# Patient Record
Sex: Female | Born: 1955 | Race: Black or African American | Hispanic: No | Marital: Married | Smoking: Former smoker
Health system: Southern US, Community
[De-identification: ages and names within clinical notes are randomized; demographics above are authoritative.]

## PROBLEM LIST (undated history)

## (undated) DIAGNOSIS — I10 Essential (primary) hypertension: Secondary | ICD-10-CM

## (undated) DIAGNOSIS — Z974 Presence of external hearing-aid: Secondary | ICD-10-CM

## (undated) DIAGNOSIS — G35 Multiple sclerosis: Secondary | ICD-10-CM

## (undated) DIAGNOSIS — H539 Unspecified visual disturbance: Secondary | ICD-10-CM

## (undated) HISTORY — PX: OVARIAN CYST REMOVAL: SHX89

## (undated) HISTORY — DX: Essential (primary) hypertension: I10

## (undated) HISTORY — PX: APPENDECTOMY: SHX54

## (undated) HISTORY — DX: Unspecified visual disturbance: H53.9

## (undated) HISTORY — PX: EYE SURGERY: SHX253

## (undated) HISTORY — DX: Presence of external hearing-aid: Z97.4

## (undated) HISTORY — DX: Multiple sclerosis: G35

---

## 2015-10-20 ENCOUNTER — Ambulatory Visit (INDEPENDENT_AMBULATORY_CARE_PROVIDER_SITE_OTHER): Payer: Managed Care, Other (non HMO) | Admitting: Neurology

## 2015-10-20 ENCOUNTER — Encounter: Payer: Self-pay | Admitting: Neurology

## 2015-10-20 VITALS — BP 130/94 | HR 68 | Resp 16 | Ht 64.0 in | Wt 221.5 lb

## 2015-10-20 DIAGNOSIS — R269 Unspecified abnormalities of gait and mobility: Secondary | ICD-10-CM

## 2015-10-20 DIAGNOSIS — G35 Multiple sclerosis: Secondary | ICD-10-CM

## 2015-10-20 DIAGNOSIS — R5383 Other fatigue: Secondary | ICD-10-CM | POA: Diagnosis not present

## 2015-10-20 MED ORDER — VALSARTAN 160 MG PO TABS: 160.0000 mg | ORAL_TABLET | Freq: Every day | ORAL | Status: DC

## 2015-10-20 MED ORDER — GLATIRAMER ACETATE 40 MG/ML ~~LOC~~ SOSY: PREFILLED_SYRINGE | SUBCUTANEOUS | Status: DC

## 2015-10-20 NOTE — Progress Notes (Signed)
[facility]  PATIENT: Dominique Rogers DOB: 1955/06/16  REFERRING DOCTOR OR PCP:  Joan Mayans (fax [phone]( SOURCE: patient, records from Drs. Nile Riggs and Glade Lloyd, labwork.  _________________________________   HISTORICAL  CHIEF COMPLAINT:  Chief Complaint  Patient presents with  . Multiple Sclerosis    Dominique Rogers is here for eval of MS.  Sts. she was dx. in 2004.  Presenting sx. was optic neuritis (right eye).  Dx. with MRI brain.  No LP done.  She initially saw Dr. Jolayne Haines in [place], [place].  He started her on Avonex, which she stopped after 10 yrs. due to new lesions on MRI. By that time, she was seeing Dr. Josefina Do, in [place], [place]. Dr. Glade Lloyd started her on Tecfidera, but she stopped this after about a yr. due to ? low lymmphocyte count. She then started Copaxone, which she is   . Fatigue    still on today.  Sts. she feels sx. have been stable on Copaxone, doesn't think most recent MRI (done at [facility] in [place], [place]  Sts. the sx. that is most noticeable to her is fatigue.  She needs a new rx. for Copaxone--gave last inj. last night and is now out/fim    HISTORY OF PRESENT ILLNESS:  I had the pleasure seeing your patient, Dominique Rogers, at [facility] for a neurologic consultation regarding her multiple sclerosis.    She is a 60 yo woman who was diagnosed with MS in 2004.   MS history: She was diagnosed in 2004 after presenting with right optic neuritis.   She saw her ophthalmologist who ordered an MRI. The images were consistent with multiple sclerosis and she was referred to Dr. Vito Backers in [place]. He did not think that she needed to have a lumbar puncture. She was started on Avonex initially did very well at some point she switch to another doctor, Dr. Glade Lloyd in [place]. She was having mild side effects with Avonex and MRI showed mild progression and she switched to  Tecfidera. However, on Tecfidera she had low lymphocyte counts and it was discontinued.  She was started on Copaxone about 2 years ago. She continues on Copaxone and has done well. Her most recent MRI was around August 2016 and she was told that it was stable. She is just about out of her Copaxone shots.  Gait/strength/sensation:   She reports no major difficulties with her gait. She has never needed to use a cane. She denies any significant difficulty with weakness or numbness. There are no uncomfortable dysesthetic sensations.  Bladder/bowel: In the past, she has urinary frequency and urgency. She never had incontinence. She was once on a medication but no longer needs to take anything as the frequency has improved. Denies any bowel dysfunction.  Vision: She got a complete recovery of the vision from her earlier bout of optic neuritis. She denies any diplopia or eye pain.  Fatigue/sleep: She reports fatigue is both physical and cognitive. This usually worsens as the day goes on and will also worsen with heat never taken anything for the fatigue. This is probably her biggest problem at this point. She usually sleeps well. She occasionally will take a melatonin. Since moving to [facility], she has needed to commute back and forth to [facility] about once a week. The rest of the time she calls from 6 AM to 9 PM still has a fairly variable schedule.  Mood/cognition: She notes some irritability and  mild depression but nothing serious. She denies anxiety. She has not had any significant issues with focus and attention. At times, she feels her memory is not as good as it used to be.  REVIEW OF SYSTEMS: Constitutional: No fevers, chills, sweats, or change in appetite.  She reports fatigue.   Occasional insomnia. Eyes: No visual changes, double vision, eye pain Ear, nose and throat: No hearing loss, ear pain, nasal congestion, sore throat Cardiovascular: No chest pain, palpitations Respiratory: No  shortness of breath at rest or with exertion.   No wheezes GastrointestinaI: No nausea, vomiting, diarrhea, abdominal pain, fecal incontinence Genitourinary: No dysuria, urinary retention or frequency.  No nocturia. Musculoskeletal: No neck pain, back pain Integumentary: No rash, pruritus, skin lesions Neurological: as above Psychiatric: Mild depression at this time.  No anxiety Endocrine: No palpitations, diaphoresis, change in appetite, change in weigh or increased thirst Hematologic/Lymphatic: No anemia, purpura, petechiae. Allergic/Immunologic: No itchy/runny eyes, nasal congestion, recent allergic reactions, rashes  ALLERGIES: No Known Allergies  HOME MEDICATIONS:  Current outpatient prescriptions:  .  cholecalciferol (VITAMIN D) 1000 units tablet, Take 1,000 Units by mouth daily., Disp: , Rfl:  .  ezetimibe (ZETIA) 10 MG tablet, Take 10 mg by mouth daily., Disp: , Rfl:  .  Multiple Vitamin (MULTIVITAMIN) tablet, Take 1 tablet by mouth daily., Disp: , Rfl:  .  vitamin B-12 (CYANOCOBALAMIN) 1000 MCG tablet, Take 1,000 mcg by mouth daily., Disp: , Rfl:  .  Glatiramer Acetate (COPAXONE) 40 MG/ML SOSY, One subcutaneous three times a week, Disp: 30 Syringe, Rfl: 4 .  valsartan (DIOVAN) 160 MG tablet, Take 1 tablet (160 mg total) by mouth daily., Disp: 90 tablet, Rfl: 4  PAST MEDICAL HISTORY: Past Medical History  Diagnosis Date  . Vision abnormalities   . Multiple sclerosis (HCC)   . Hypertension     PAST SURGICAL HISTORY: Past Surgical History  Procedure Laterality Date  . Appendectomy    . Ovarian cyst removal    . Eye surgery      FAMILY HISTORY: Family History  Problem Relation Age of Onset  . Diabetes Mother   . Heart disease Mother   . Breast cancer Mother   . Breast cancer Sister     SOCIAL HISTORY:  Social History   Social History  . Marital Status: Married    Spouse Name: N/A  . Number of Children: N/A  . Years of Education: N/A   Occupational  History  . Not on file.   Social History Main Topics  . Smoking status: Former Games developer  . Smokeless tobacco: Not on file  . Alcohol Use: 0.0 oz/week    0 Standard drinks or equivalent per week     Comment: occasional  . Drug Use: No  . Sexual Activity: Not on file   Other Topics Concern  . Not on file   Social History Narrative  . No narrative on file     PHYSICAL EXAM  Filed Vitals:   10/20/15 1029  BP: 130/94  Pulse: 68  Resp: 16  Height:  (1.626 m)  Weight: 221 lb 8 oz (100.472 kg)    Body mass index is 38 kg/(m^2).   General: The patient is well-developed and well-nourished and in no acute distress  Eyes:  Funduscopic exam shows normal optic discs and retinal vessels.   She has astigmatism.     Neck: The neck is supple, no carotid bruits are noted.  The neck is nontender.  Cardiovascular: The heart has  a regular rate and rhythm with a normal S1 and S2. There were no murmurs, gallops or rubs. Lungs are clear to auscultation.  Skin: Extremities are without rash or edema.  Nontender  Musculoskeletal:  Back is nontender  Neurologic Exam  Mental status: The patient is alert and oriented x 3 at the time of the examination. The patient has apparent normal recent and remote memory, with an apparently normal attention span and concentration ability.   Speech is normal.  Cranial nerves: Extraocular movements are full. Pupils show 1+ right APD.  Color vision is symmetric.  Visual fields are full.  Facial symmetry is present. There is good facial sensation to soft touch bilaterally.Facial strength is normal.  Trapezius and sternocleidomastoid strength is normal. No dysarthria is noted.  The tongue is midline, and the patient has symmetric elevation of the soft palate. No obvious hearing deficits are noted.  Motor:  Muscle bulk is normal.   Tone is normal. Strength is  5 / 5 in all 4 extremities.   Sensory: Sensory testing is intact to pinprick, soft touch and  vibration sensation in all 4 extremities.  Coordination: Cerebellar testing reveals good finger-nose-finger and heel-to-shin bilaterally.  Gait and station: Station is normal.   Gait is normal. Tandem gait is mildly wide. Romberg is negative.   Reflexes: Deep tendon reflexes are symmetric and normal bilaterally.   Plantar responses are flexor.     DIAGNOSTIC DATA (LABS, IMAGING, TESTING) - I reviewed patient records, labs, notes, testing and imaging myself where available.     ASSESSMENT AND PLAN  Multiple sclerosis (HCC)  Other fatigue  Gait disturbance   In summary, Dominique Rogers is a 60 year old woman diagnosed with MS about 13 years ago and has done well for the most part., She shows some sequela of her prior optic neuritis and has a mildly wide gait.     She appears to be doing fairly well on Copaxone injections as a disease modifying therapies.   We discussed options and she wishes to remain on that therapy. I will reenroll her into the [employer] program.     We discussed her urinary frequency but she does not feel it is severe enough to consider treatment with medication at this time. She is advised to continue to take vitamin D supplementation.  She will return to see me in 6 months but call sooner if she has any new or worsening neurologic symptoms. We discussed some of the signs and symptoms of a relapse.  Thank you for asking me to see Dominique Rogers for a neurologic consultation. Please let me know if I can be of further assistance in the future.   Conan Mcmanaway A. Epimenio Foot, MD, PhD 10/20/2015, 10:58 AM Certified in Neurology, Clinical Neurophysiology, Sleep Medicine, Pain Medicine and Neuroimaging  [facility] [address], [address] [place], [place] [address] [phone]

## 2015-10-21 ENCOUNTER — Encounter: Payer: Self-pay | Admitting: *Deleted

## 2015-10-22 ENCOUNTER — Telehealth: Payer: Self-pay | Admitting: Neurology

## 2015-10-22 MED ORDER — GLATIRAMER ACETATE 40 MG/ML ~~LOC~~ SOSY: PREFILLED_SYRINGE | SUBCUTANEOUS | Status: DC

## 2015-10-22 NOTE — Telephone Encounter (Signed)
Copaxone escribed as requested/fim

## 2015-10-22 NOTE — Telephone Encounter (Signed)
Pt called said the [facility] told her the wrong address was on the RX for Glatiramer Acetate (COPAXONE) 40 MG/ML SOSY . Operator corrected pt's address. Please resend RX. Thank you

## 2015-10-29 MED ORDER — GLATIRAMER ACETATE 40 MG/ML ~~LOC~~ SOSY: PREFILLED_SYRINGE | SUBCUTANEOUS | Status: DC

## 2015-10-29 NOTE — Addendum Note (Signed)
Addended by: Candis Schatz I on: 10/29/2015 02:01 PM   Modules accepted: Orders

## 2015-10-29 NOTE — Telephone Encounter (Signed)
Pt called in stating [facility] is telling her they have not received the Rx.

## 2015-10-29 NOTE — Addendum Note (Signed)
Addended by: Candis Schatz I on: 10/29/2015 02:02 PM   Modules accepted: Orders

## 2015-10-29 NOTE — Telephone Encounter (Signed)
Copaxone rx. called to [facility] phone # [phone]/fim

## 2015-12-31 ENCOUNTER — Other Ambulatory Visit: Payer: Self-pay | Admitting: *Deleted

## 2016-02-09 ENCOUNTER — Other Ambulatory Visit: Payer: Self-pay | Admitting: Obstetrics and Gynecology

## 2016-02-09 ENCOUNTER — Other Ambulatory Visit (HOSPITAL_COMMUNITY)
Admission: RE | Admit: 2016-02-09 | Discharge: 2016-02-09 | Disposition: A | Discharge status: Home / Self Care | Payer: Managed Care, Other (non HMO) | Source: Ambulatory Visit | Attending: Obstetrics and Gynecology | Admitting: Obstetrics and Gynecology

## 2016-02-09 DIAGNOSIS — Z1151 Encounter for screening for human papillomavirus (HPV): Secondary | ICD-10-CM | POA: Insufficient documentation

## 2016-02-09 DIAGNOSIS — Z01419 Encounter for gynecological examination (general) (routine) without abnormal findings: Secondary | ICD-10-CM | POA: Insufficient documentation

## 2016-02-10 ENCOUNTER — Other Ambulatory Visit: Payer: Self-pay | Admitting: Obstetrics and Gynecology

## 2016-02-10 DIAGNOSIS — Z1231 Encounter for screening mammogram for malignant neoplasm of breast: Secondary | ICD-10-CM

## 2016-02-10 LAB — CYTOLOGY - PAP

## 2016-02-19 ENCOUNTER — Ambulatory Visit
Admission: RE | Admit: 2016-02-19 | Discharge: 2016-02-19 | Disposition: A | Discharge status: Home / Self Care | Payer: Managed Care, Other (non HMO) | Source: Ambulatory Visit | Attending: Obstetrics and Gynecology | Admitting: Obstetrics and Gynecology

## 2016-02-19 DIAGNOSIS — Z1231 Encounter for screening mammogram for malignant neoplasm of breast: Secondary | ICD-10-CM

## 2016-04-12 ENCOUNTER — Telehealth: Payer: Self-pay | Admitting: Neurology

## 2016-04-12 ENCOUNTER — Telehealth: Payer: Self-pay | Admitting: *Deleted

## 2016-04-12 NOTE — Telephone Encounter (Signed)
Fax received from [facility].  Copaxone PA approved.  Effective dates 03-13-16 thru 04-12-17.  Case ID: [id number]./VOH

## 2016-04-12 NOTE — Telephone Encounter (Signed)
Copaxone PA completed and faxed to [facility], fax# [phone]/fim

## 2016-04-12 NOTE — Telephone Encounter (Signed)
Kennyth ArnoldStacy with [facility] is calling regarding PA for medication Glatiramer Acetate (COPAXONE) 40 MG/ML SOSY for the patient.

## 2016-04-12 NOTE — Telephone Encounter (Signed)
Copaxone PA has been completed and faxed back to [facility] fax# [phone]/fim

## 2016-04-20 ENCOUNTER — Encounter: Payer: Self-pay | Admitting: Neurology

## 2016-04-20 ENCOUNTER — Ambulatory Visit (INDEPENDENT_AMBULATORY_CARE_PROVIDER_SITE_OTHER): Payer: Managed Care, Other (non HMO) | Admitting: Neurology

## 2016-04-20 VITALS — BP 118/70 | HR 68 | Resp 18 | Ht 64.0 in | Wt 217.5 lb

## 2016-04-20 DIAGNOSIS — G35 Multiple sclerosis: Secondary | ICD-10-CM

## 2016-04-20 DIAGNOSIS — R5383 Other fatigue: Secondary | ICD-10-CM | POA: Diagnosis not present

## 2016-04-20 DIAGNOSIS — R269 Unspecified abnormalities of gait and mobility: Secondary | ICD-10-CM

## 2016-04-20 NOTE — Progress Notes (Signed)
[facility]  PATIENT: Dominique Rogers DOB: Sep 20, 1955  REFERRING DOCTOR OR PCP:  Joan Mayans (fax [phone]( SOURCE: patient, records from Drs. Nile Riggs and Glade Lloyd, labwork.  _________________________________   HISTORICAL  CHIEF COMPLAINT:  Chief Complaint  Patient presents with  . Multiple Sclerosis    Sts. she continues to tolerate Copaxone well.  Sts. she feels like she has decreased hearing bilaterally, over the last 2-3 yrs.  "slowly getting worse"/fim    HISTORY OF PRESENT ILLNESS:  Dominique Rogers is a 60 yo woman who was diagnosed with MS in 2004.   MS:   She is on Copaxone and has done well. Her most recent MRI was around August 2016 it is reportedly normal. She is just about out of her Copaxone shots.  Hearing Loss:   She reports that over the past year, she has had mild hearing loss.   She has trouble hearing people and neds to turn the volume up.     She denies any vertigo.     Gait/strength/sensation:   She reports no major difficulties with her gait. She has never needed to use a cane. She denies any significant difficulty with weakness or numbness. There are no uncomfortable dysesthetic sensations.  Bladder/bowel: In the past, she has urinary frequency and urgency. She never had incontinence. She was once on a medication but no longer needs to take anything as the frequency has improved. Denies any bowel dysfunction.  Vision: She got a complete recovery of the vision from her earlier bout of optic neuritis. She denies any diplopia or eye pain.  Fatigue/sleep: She has physical and cognitive fatigue but it does not prevent her from doing what she needs to do.  Fatigue worsens as the day goes on and will also worsen with heat.   She usually sleeps well. She occasionally will take a melatonin.   She occasionally has sleep maintenance issues, not bad enough to treat. Since moving to [facility], she has needed to commute back and forth to [place]  about  a week every month. The rest of the time she calls from 6 AM to 9 PM still has a fairly variable schedule.  Mood/cognition: She denies depression and anxiety. She has not had any significant issues with focus and attention. At times, she feels her memory is not as good as it used to be.   She works for [employer] as a Psychologist, educational  MS history: She was diagnosed in 2004 after presenting with right optic neuritis.   She saw her ophthalmologist who ordered an MRI. The images were consistent with multiple sclerosis and she was referred to Dr. Vito Backers in [place]. He did not think that she needed to have a lumbar puncture. She was started on Avonex initially did very well at some point she switch to another doctor, Dr. Glade Lloyd in [place]. She was having mild side effects with Avonex and MRI showed mild progression and she switched to Tecfidera. However, on Tecfidera she had low lymphocyte counts and it was discontinued.  She was started on Copaxone about 2 years ago  REVIEW OF SYSTEMS: Constitutional: No fevers, chills, sweats, or change in appetite.  She reports fatigue.   Occasional insomnia. Eyes: No visual changes, double vision, eye pain Ear, nose and throat: No hearing loss, ear pain, nasal congestion, sore throat Cardiovascular: No chest pain, palpitations Respiratory: No shortness of breath at rest or with exertion.   No wheezes GastrointestinaI: No nausea, vomiting, diarrhea, abdominal  pain, fecal incontinence Genitourinary: No dysuria, urinary retention or frequency.  No nocturia. Musculoskeletal: No neck pain, back pain Integumentary: No rash, pruritus, skin lesions Neurological: as above Psychiatric: Mild depression at this time.  No anxiety Endocrine: No palpitations, diaphoresis, change in appetite, change in weigh or increased thirst Hematologic/Lymphatic: No anemia, purpura, petechiae. Allergic/Immunologic: No itchy/runny eyes, nasal congestion, recent  allergic reactions, rashes  ALLERGIES: No Known Allergies  HOME MEDICATIONS:  Current Outpatient Prescriptions:  .  bimatoprost (LUMIGAN) 0.03 % ophthalmic solution, 1 drop at bedtime., Disp: , Rfl:  .  brimonidine (ALPHAGAN P) 0.1 % SOLN, , Disp: , Rfl:  .  cholecalciferol (VITAMIN D) 1000 units tablet, Take 1,000 Units by mouth daily., Disp: , Rfl:  .  dorzolamide-timolol (COSOPT) 22.3-6.8 MG/ML ophthalmic solution, 1 drop 2 (two) times daily., Disp: , Rfl:  .  ezetimibe (ZETIA) 10 MG tablet, Take 10 mg by mouth daily., Disp: , Rfl:  .  Glatiramer Acetate (COPAXONE) 40 MG/ML SOSY, One subcutaneous three times a week, Disp: 30 Syringe, Rfl: 4 .  Multiple Vitamin (MULTIVITAMIN) tablet, Take 1 tablet by mouth daily., Disp: , Rfl:  .  valsartan (DIOVAN) 160 MG tablet, Take 1 tablet (160 mg total) by mouth daily., Disp: 90 tablet, Rfl: 4 .  vitamin B-12 (CYANOCOBALAMIN) 1000 MCG tablet, Take 1,000 mcg by mouth daily., Disp: , Rfl:   PAST MEDICAL HISTORY: Past Medical History:  Diagnosis Date  . Hypertension   . Multiple sclerosis (HCC)   . Vision abnormalities     PAST SURGICAL HISTORY: Past Surgical History:  Procedure Laterality Date  . APPENDECTOMY    . EYE SURGERY    . OVARIAN CYST REMOVAL      FAMILY HISTORY: Family History  Problem Relation Age of Onset  . Diabetes Mother   . Heart disease Mother   . Breast cancer Mother   . Breast cancer Sister     SOCIAL HISTORY:  Social History   Social History  . Marital status: Married    Spouse name: N/A  . Number of children: N/A  . Years of education: N/A   Occupational History  . Not on file.   Social History Main Topics  . Smoking status: Former Games developer  . Smokeless tobacco: Not on file  . Alcohol use 0.0 oz/week     Comment: occasional  . Drug use: No  . Sexual activity: Not on file   Other Topics Concern  . Not on file   Social History Narrative  . No narrative on file     PHYSICAL EXAM  Vitals:     04/20/16 1038  BP: 118/70  Pulse: 68  Resp: 18  Weight: 217 lb 8 oz (98.7 kg)  Height: 5\' 4"  (1.626 m)    Body mass index is 37.33 kg/m.   General: The patient is well-developed and well-nourished and in no acute distress   Neurologic Exam  Mental status: The patient is alert and oriented x 3 at the time of the examination. The patient has apparent normal recent and remote memory, with an apparently normal attention span and concentration ability.   Speech is normal.  Cranial nerves: Extraocular movements are full. Pupils show 1+ right APD.    There is good facial sensation to soft touch bilaterally.Facial strength is normal.  Trapezius and sternocleidomastoid strength is normal. No dysarthria is noted.  The tongue is midline, and the patient has symmetric elevation of the soft palate. No obvious hearing deficits are noted.  Motor:  Muscle bulk is normal.   Tone is normal. Strength is  5 / 5 in all 4 extremities.   Sensory: Sensory testing is intact to pinprick, soft touch and vibration sensation in all 4 extremities.  Coordination: Cerebellar testing reveals good finger-nose-finger bilaterally.  Gait and station: Station is normal.   Gait is normal. Tandem gait is mildly wide. Romberg is negative.   Reflexes: Deep tendon reflexes are symmetric and normal bilaterally.       DIAGNOSTIC DATA (LABS, IMAGING, TESTING) - I reviewed patient records, labs, notes, testing and imaging myself where available.     ASSESSMENT AND PLAN  Multiple sclerosis (HCC) - Plan: MR BRAIN W WO CONTRAST  Gait disturbance - Plan: MR BRAIN W WO CONTRAST  Other fatigue   1.   Continue Copaxone.  2.   We will check an MRI of the brain with and without contrast to determine if she has had any subclinical progression while on Copaxone.  If that is occurring, we will need to consider an agent with better efficacy.  3.    She will return to see me in 6 months but call sooner if she has any new  or worsening neurologic symptoms. W   Jerusalem Wert A. Epimenio FootSater, MD, PhD 04/20/2016, 11:11 AM Certified in Neurology, Clinical Neurophysiology, Sleep Medicine, Pain Medicine and Neuroimaging  [facility] [address], [address] [place], [place] [id number] [phone]

## 2016-05-05 ENCOUNTER — Ambulatory Visit
Admission: RE | Admit: 2016-05-05 | Discharge: 2016-05-05 | Disposition: A | Discharge status: Home / Self Care | Payer: Managed Care, Other (non HMO) | Source: Ambulatory Visit | Attending: Neurology | Admitting: Neurology

## 2016-05-05 DIAGNOSIS — G35 Multiple sclerosis: Secondary | ICD-10-CM | POA: Diagnosis not present

## 2016-05-05 DIAGNOSIS — R269 Unspecified abnormalities of gait and mobility: Secondary | ICD-10-CM

## 2016-05-05 MED ORDER — GADOBENATE DIMEGLUMINE 529 MG/ML IV SOLN
20.0000 mL | Freq: Once | INTRAVENOUS | Status: AC | PRN
  Administered 2016-05-05: 20 mL via INTRAVENOUS

## 2016-05-12 ENCOUNTER — Telehealth: Payer: Self-pay | Admitting: *Deleted

## 2016-05-12 NOTE — Telephone Encounter (Signed)
I have spoken with Dominique Rogers this afternoon and per RAS, explained that MRI brain does not show any brand new MS lesions.  She verbalized understanding of same/fim

## 2016-05-12 NOTE — Telephone Encounter (Signed)
-----   Message from Asa Lente, MD sent at 05/09/2016  6:05 PM EST ----- Please let her know that the MRI of the brain did not show any brand-new MS lesions.

## 2016-09-29 ENCOUNTER — Telehealth: Payer: Self-pay | Admitting: *Deleted

## 2016-09-29 MED ORDER — GLATIRAMER ACETATE 40 MG/ML ~~LOC~~ SOSY: PREFILLED_SYRINGE | SUBCUTANEOUS | 4 refills | Status: DC

## 2016-09-29 NOTE — Telephone Encounter (Signed)
Copaxone escribed to [facility] per faxed request/fim

## 2016-10-07 ENCOUNTER — Telehealth: Payer: Self-pay | Admitting: *Deleted

## 2016-10-07 MED ORDER — GLATIRAMER ACETATE 40 MG/ML ~~LOC~~ SOSY: PREFILLED_SYRINGE | SUBCUTANEOUS | 4 refills | Status: DC

## 2016-10-07 NOTE — Telephone Encounter (Signed)
Copaxone escribed to [facility] per faxed request/fim 

## 2016-10-19 ENCOUNTER — Ambulatory Visit: Payer: Managed Care, Other (non HMO) | Admitting: Neurology

## 2016-12-07 ENCOUNTER — Ambulatory Visit: Payer: Managed Care, Other (non HMO) | Admitting: Neurology

## 2016-12-29 ENCOUNTER — Encounter: Payer: Self-pay | Admitting: Neurology

## 2016-12-29 ENCOUNTER — Ambulatory Visit (INDEPENDENT_AMBULATORY_CARE_PROVIDER_SITE_OTHER): Payer: Managed Care, Other (non HMO) | Admitting: Neurology

## 2016-12-29 VITALS — BP 113/67 | HR 69 | Resp 18 | Ht 64.0 in | Wt 206.0 lb

## 2016-12-29 DIAGNOSIS — H9193 Unspecified hearing loss, bilateral: Secondary | ICD-10-CM | POA: Insufficient documentation

## 2016-12-29 DIAGNOSIS — R269 Unspecified abnormalities of gait and mobility: Secondary | ICD-10-CM | POA: Diagnosis not present

## 2016-12-29 DIAGNOSIS — R5383 Other fatigue: Secondary | ICD-10-CM | POA: Diagnosis not present

## 2016-12-29 DIAGNOSIS — G35 Multiple sclerosis: Secondary | ICD-10-CM

## 2016-12-29 NOTE — Progress Notes (Signed)
[facility]  PATIENT: Dominique Rogers DOB: 06-24-1955  REFERRING DOCTOR OR PCP:  Joan Mayans (fax [phone]( SOURCE: patient, records from Drs. Nile Riggs and Glade Lloyd, labwork.  _________________________________   HISTORICAL  CHIEF COMPLAINT:  Chief Complaint  Patient presents with  . Multiple Sclerosis    Sts. she is doing well with Copaxone.  Would like a referral to be tested for hearing loss/fim    HISTORY OF PRESENT ILLNESS:  Dominique Rogers is a 61 yo woman who was diagnosed with MS in 2004.   MS:   She is on Copaxone and has done well. MRI 05/2016 showed classic MS lesions and no new/acute foci Hearing Loss:   She reports that over the past year, she has had mild hearing loss.   She has trouble hearing people and neds to turn the volume up.     She denies any vertigo.     Gait/strength/sensation:    She feels her gait is doing well she occasionally feels slightly off balance.    She denies any significant difficulty with weakness or numbness. There are no uncomfortable dysesthetic sensations.  Bladder/bowel: In the past, she has urinary frequency and urgency. She never had incontinence. She was once on a medication but no longer needs to take anything as the frequency has improved. Denies any bowel dysfunction.  Vision:   She has a history of optic neurit but recovered well.   She denies any diplopia or eye pain.  Fatigue/sleep: She has mild physical physical and cognitive fatigue that is worse as the day goes on and will also worsen with heat.   She usually sleeps well. She occasionally will take a melatonin.   She has mild sleep maintenance insomnia some nights.   Mood/cognition: She denies depression and anxiety. She has not had any significant issues with focus and attention. At times, she feels her memory is not as good as it used to be.   She works for [employer] as a Psychologist, educational  MS history: She was diagnosed in 2004 after presenting with right optic  neuritis.   She saw her ophthalmologist who ordered an MRI. The images were consistent with multiple sclerosis and she was referred to Dr. Vito Backers in [place]. He did not think that she needed to have a lumbar puncture. She was started on Avonex initially did very well at some point she switch to another doctor, Dr. Glade Lloyd in [place]. She was having mild side effects with Avonex and MRI showed mild progression and she switched to Tecfidera. However, on Tecfidera she had low lymphocyte counts and it was discontinued.  She was started on Copaxone about 2 years ago  REVIEW OF SYSTEMS: Constitutional: No fevers, chills, sweats, or change in appetite.  She reports fatigue.   Occasional insomnia. Eyes: No visual changes, double vision, eye pain Ear, nose and throat: No hearing loss, ear pain, nasal congestion, sore throat Cardiovascular: No chest pain, palpitations Respiratory: No shortness of breath at rest or with exertion.   No wheezes GastrointestinaI: No nausea, vomiting, diarrhea, abdominal pain, fecal incontinence Genitourinary: No dysuria, urinary retention or frequency.  No nocturia. Musculoskeletal: No neck pain, back pain Integumentary: No rash, pruritus, skin lesions Neurological: as above Psychiatric: Mild depression at this time.  No anxiety Endocrine: No palpitations, diaphoresis, change in appetite, change in weigh or increased thirst Hematologic/Lymphatic: No anemia, purpura, petechiae. Allergic/Immunologic: No itchy/runny eyes, nasal congestion, recent allergic reactions, rashes  ALLERGIES: No Known Allergies  HOME MEDICATIONS:  Current Outpatient Prescriptions:  .  bimatoprost (LUMIGAN) 0.03 % ophthalmic solution, 1 drop at bedtime., Disp: , Rfl:  .  brimonidine (ALPHAGAN P) 0.1 % SOLN, , Disp: , Rfl:  .  cholecalciferol (VITAMIN D) 1000 units tablet, Take 1,000 Units by mouth daily., Disp: , Rfl:  .  dorzolamide-timolol (COSOPT) 22.3-6.8 MG/ML  ophthalmic solution, 1 drop 2 (two) times daily., Disp: , Rfl:  .  ezetimibe (ZETIA) 10 MG tablet, Take 10 mg by mouth daily., Disp: , Rfl:  .  Glatiramer Acetate (COPAXONE) 40 MG/ML SOSY, One subcutaneous three times a week, Disp: 30 Syringe, Rfl: 4 .  Multiple Vitamin (MULTIVITAMIN) tablet, Take 1 tablet by mouth daily., Disp: , Rfl:  .  valsartan (DIOVAN) 160 MG tablet, Take 1 tablet (160 mg total) by mouth daily., Disp: 90 tablet, Rfl: 4 .  vitamin B-12 (CYANOCOBALAMIN) 1000 MCG tablet, Take 1,000 mcg by mouth daily., Disp: , Rfl:   PAST MEDICAL HISTORY: Past Medical History:  Diagnosis Date  . Hypertension   . Multiple sclerosis (HCC)   . Vision abnormalities     PAST SURGICAL HISTORY: Past Surgical History:  Procedure Laterality Date  . APPENDECTOMY    . EYE SURGERY    . OVARIAN CYST REMOVAL      FAMILY HISTORY: Family History  Problem Relation Age of Onset  . Diabetes Mother   . Heart disease Mother   . Breast cancer Mother   . Breast cancer Sister     SOCIAL HISTORY:  Social History   Social History  . Marital status: Married    Spouse name: N/A  . Number of children: N/A  . Years of education: N/A   Occupational History  . Not on file.   Social History Main Topics  . Smoking status: Former Games developer  . Smokeless tobacco: Never Used  . Alcohol use 0.0 oz/week     Comment: occasional  . Drug use: No  . Sexual activity: Not on file   Other Topics Concern  . Not on file   Social History Narrative  . No narrative on file     PHYSICAL EXAM  Vitals:   12/29/16 1112  BP: 113/67  Pulse: 69  Resp: 18  Weight: 206 lb (93.4 kg)  Height: 5\' 4"  (1.626 m)    Body mass index is 35.36 kg/m.   General: The patient is well-developed and well-nourished and in no acute distress   Neurologic Exam  Mental status: The patient is alert and oriented x 3 at the time of the examination. The patient has apparent normal recent and remote memory, with an  apparently normal attention span and concentration ability.   Speech is normal.  Cranial nerves: Extraocular movements are full. Pupils show 1+ right APD.   Facial strength and sensation is normal. Trapezius strength is normal. No dysarthria is noted.  The tongue is midline, and the patient has symmetric elevation of the soft palate. No obvious hearing deficits are noted.  Motor:  Muscle bulk is normal.   Tone is normal. Strength is  5 / 5 in all 4 extremities.   Sensory: Sensory testing is intact to pinprick, soft touch and vibration sensation in all 4 extremities.  Coordination: Cerebellar testing reveals good finger-nose-finger bilaterally.  Gait and station: Station is normal.   Gait is normal. Tandem gait is mildly wide. Romberg is negative.   Reflexes: Deep tendon reflexes are symmetric and normal bilaterally.       DIAGNOSTIC DATA (LABS, IMAGING, TESTING) -  I reviewed patient records, labs, notes, testing and imaging myself where available.     ASSESSMENT AND PLAN  Multiple sclerosis (HCC)  Other fatigue  Gait disturbance  Bilateral hearing loss, unspecified hearing loss type - Plan: Ambulatory referral to ENT   1.   Continue Copaxone.  2.   Refer to ENT for hearing loss  3.   She will return to see me in 6 months but call sooner if she has any new or worsening neurologic symptoms. W   Shameeka Silliman A. Epimenio Foot, MD, PhD 12/29/2016, 3:16 PM Certified in Neurology, Clinical Neurophysiology, Sleep Medicine, Pain Medicine and Neuroimaging  [facility] [address], [address] [place], [place] [address] [phone]

## 2017-02-14 ENCOUNTER — Other Ambulatory Visit: Payer: Self-pay | Admitting: Obstetrics and Gynecology

## 2017-02-14 DIAGNOSIS — Z1231 Encounter for screening mammogram for malignant neoplasm of breast: Secondary | ICD-10-CM

## 2017-04-21 ENCOUNTER — Ambulatory Visit
Admission: RE | Admit: 2017-04-21 | Discharge: 2017-04-21 | Disposition: A | Discharge status: Home / Self Care | Payer: Managed Care, Other (non HMO) | Source: Ambulatory Visit | Attending: Obstetrics and Gynecology | Admitting: Obstetrics and Gynecology

## 2017-04-21 DIAGNOSIS — Z1231 Encounter for screening mammogram for malignant neoplasm of breast: Secondary | ICD-10-CM

## 2017-06-12 ENCOUNTER — Telehealth: Payer: Self-pay | Admitting: *Deleted

## 2017-06-12 MED ORDER — GLATIRAMER ACETATE 40 MG/ML ~~LOC~~ SOSY: 40.0000 mg | PREFILLED_SYRINGE | SUBCUTANEOUS | 3 refills | Status: DC

## 2017-06-12 NOTE — Telephone Encounter (Signed)
Received request for PA for brand name Copaxone.  Spoke with pt. to confirm she is currently taking brand name; see if she has tried generic.  She is taking Copaxone now; denies having tried Glatiramer, but is agreeable to doing so.  Rx. for Glatiramer 40mg  SQ 3 times per week escribed to [facility]. Glatiramer srf and rx. for WhisperJect faxed to [facility], fax# [phone]./fim

## 2017-06-13 ENCOUNTER — Encounter: Payer: Self-pay | Admitting: *Deleted

## 2017-06-15 ENCOUNTER — Telehealth: Payer: Self-pay | Admitting: *Deleted

## 2017-06-15 NOTE — Telephone Encounter (Signed)
PA for Copaxone 40mg /ml SQ 3 times weekly completed and faxed to [facility], fax# [phone].  Dx: RRMS (G35). Tried and failed meds: Avonex (relapse--new lesions on MRI), Tecfidera (adverse effect--low lymphocyte count).  She has been clinically stable on Copaxone for the last 3-4 yrs., so physician prefers that she continue it, as changing meds could put her at risk for relapse/perm. disability/fim

## 2017-06-15 NOTE — Telephone Encounter (Signed)
LMTC.  Need to talk to her about her Copaxone/fim

## 2017-06-15 NOTE — Telephone Encounter (Signed)
[facility] calling requesting a call back stating they only got the office notes but will need the actual form for the PA contact at [phone]  Case number [id number]

## 2017-06-15 NOTE — Telephone Encounter (Signed)
Pt is asking for a call back from RN Faith [id number]

## 2017-06-16 NOTE — Telephone Encounter (Signed)
Spoke with Dois Davenport and explained [facility] has denied final request for brand name Copaxone, but approved Glatiramer.so rx. for Glatiramer has been sent to them, and rx. for WhisperJect was sent to [facility].  She verbalized understanding of same/fim

## 2017-12-29 ENCOUNTER — Encounter: Payer: Self-pay | Admitting: Neurology

## 2017-12-29 ENCOUNTER — Ambulatory Visit: Payer: Managed Care, Other (non HMO) | Admitting: Neurology

## 2017-12-29 ENCOUNTER — Other Ambulatory Visit: Payer: Self-pay

## 2017-12-29 VITALS — BP 107/71 | HR 77 | Resp 18 | Ht 64.0 in | Wt 214.0 lb

## 2017-12-29 DIAGNOSIS — G35 Multiple sclerosis: Secondary | ICD-10-CM | POA: Diagnosis not present

## 2017-12-29 DIAGNOSIS — R5383 Other fatigue: Secondary | ICD-10-CM

## 2017-12-29 DIAGNOSIS — R269 Unspecified abnormalities of gait and mobility: Secondary | ICD-10-CM

## 2017-12-29 DIAGNOSIS — H9193 Unspecified hearing loss, bilateral: Secondary | ICD-10-CM

## 2017-12-29 NOTE — Progress Notes (Signed)
[facility]  PATIENT: Dominique Rogers DOB: 11/19/1955  REFERRING DOCTOR OR PCP:  Joan Mayans (fax [phone]( SOURCE: patient, records from Drs. Nile Riggs and Glade Lloyd, labwork.  _________________________________   HISTORICAL  CHIEF COMPLAINT:  Chief Complaint  Patient presents with  . Multiple Sclerosis    Sts. she continues to tolerate Copaxone well/fim    HISTORY OF PRESENT ILLNESS:  Dominique Rogers is a 62 y.o. woman who was diagnosed with MS in 2004.   Update 12/29/2017: She is doing well and has no exacerbations..   She is on generic Copaxone.   She tolerates it well.     She is walking well.   No problems with stairs.    She denies numbness or weakness.  No dysesthesia.  Bladder is doing well and vision is doing well.   No sequelae of optic neuritis.   Mild reduced hering bilaterally is unchanged  She feels her fatigue improved with a change in jobs and less travel.   She works out of home mostly now.   She is trying to exercise more.      She is sleeping well.   No depression .  Cognition is doing well.     Last MRI was 05/05/2017 and showed classic MS lesions and a partially empty sdella turcica (she does not have papilledema).  Her weight was more when younger.    We personally reviewed the images.      From 12/29/2016: MS:   She is on Copaxone and has done well. MRI 05/2016 showed classic MS lesions and no new/acute foci Hearing Loss:   She reports that over the past year, she has had mild hearing loss.   She has trouble hearing people and neds to turn the volume up.     She denies any vertigo.     Gait/strength/sensation:    She feels her gait is doing well she occasionally feels slightly off balance.    She denies any significant difficulty with weakness or numbness. There are no uncomfortable dysesthetic sensations.  Bladder/bowel: In the past, she has urinary frequency and urgency. She never had incontinence. She was once on a medication but no longer  needs to take anything as the frequency has improved. Denies any bowel dysfunction.  Vision:   She has a history of optic neurit but recovered well.   She denies any diplopia or eye pain.  Fatigue/sleep: She has mild physical physical and cognitive fatigue that is worse as the day goes on and will also worsen with heat.   She usually sleeps well. She occasionally will take a melatonin.   She has mild sleep maintenance insomnia some nights.   Mood/cognition: She denies depression and anxiety. She has not had any significant issues with focus and attention. At times, she feels her memory is not as good as it used to be.   She works for [employer] as a Psychologist, educational  MS history: She was diagnosed in 2004 after presenting with right optic neuritis.   She saw her ophthalmologist who ordered an MRI. The images were consistent with multiple sclerosis and she was referred to Dr. Vito Backers in [place]. He did not think that she needed to have a lumbar puncture. She was started on Avonex initially did very well at some point she switch to another doctor, Dr. Glade Lloyd in [place]. She was having mild side effects with Avonex and MRI showed mild progression and she switched to Tecfidera. However, on Tecfidera she had  low lymphocyte counts and it was discontinued.  She was started on Copaxone about 2 years ago  REVIEW OF SYSTEMS: Constitutional: No fevers, chills, sweats, or change in appetite.  She reports fatigue.   Insomnia is rare now Eyes: No visual changes, double vision, eye pain Ear, nose and throat: No hearing loss, ear pain, nasal congestion, sore throat Cardiovascular: No chest pain, palpitations Respiratory: No shortness of breath at rest or with exertion.   No wheezes GastrointestinaI: No nausea, vomiting, diarrhea, abdominal pain, fecal incontinence Genitourinary: No dysuria, urinary retention or frequency.  No nocturia. Musculoskeletal: No neck pain, back pain Integumentary: No  rash, pruritus, skin lesions Psych:   No depression or anxiety Endocrine: No palpitations, diaphoresis, change in appetite, change in weigh or increased thirst Hematologic/Lymphatic: No anemia, purpura, petechiae. Allergic/Immunologic: No itchy/runny eyes, nasal congestion, recent allergic reactions, rashes  ALLERGIES: No Known Allergies  HOME MEDICATIONS:  Current Outpatient Medications:  .  brimonidine (ALPHAGAN) 0.2 % ophthalmic solution, Apply to eye., Disp: , Rfl:  .  cholecalciferol (VITAMIN D) 1000 units tablet, Take 1,000 Units by mouth daily., Disp: , Rfl:  .  dorzolamide (TRUSOPT) 2 % ophthalmic solution, Apply to eye., Disp: , Rfl:  .  ezetimibe (ZETIA) 10 MG tablet, Take 10 mg by mouth daily., Disp: , Rfl:  .  Glatiramer Acetate 40 MG/ML SOSY, Inject 40 mg into the skin 3 (three) times a week., Disp: 36 Syringe, Rfl: 3 .  Latanoprost 0.005 % EMUL, Apply to eye., Disp: , Rfl:  .  losartan-hydrochlorothiazide (HYZAAR) 50-12.5 MG tablet, , Disp: , Rfl:  .  Multiple Vitamin (MULTIVITAMIN) tablet, Take 1 tablet by mouth daily., Disp: , Rfl:  .  timolol (TIMOPTIC) 0.5 % ophthalmic solution, Apply to eye., Disp: , Rfl:   PAST MEDICAL HISTORY: Past Medical History:  Diagnosis Date  . Hypertension   . Multiple sclerosis (HCC)   . Vision abnormalities     PAST SURGICAL HISTORY: Past Surgical History:  Procedure Laterality Date  . APPENDECTOMY    . EYE SURGERY    . OVARIAN CYST REMOVAL      FAMILY HISTORY: Family History  Problem Relation Age of Onset  . Diabetes Mother   . Heart disease Mother   . Breast cancer Mother   . Breast cancer Sister     SOCIAL HISTORY:  Social History   Socioeconomic History  . Marital status: Married    Spouse name: Not on file  . Number of children: Not on file  . Years of education: Not on file  . Highest education level: Not on file  Occupational History  . Not on file  Social Needs  . Financial resource strain: Not on file   . Food insecurity:    Worry: Not on file    Inability: Not on file  . Transportation needs:    Medical: Not on file    Non-medical: Not on file  Tobacco Use  . Smoking status: Former Games developer  . Smokeless tobacco: Never Used  Substance and Sexual Activity  . Alcohol use: Yes    Alcohol/week: 0.0 standard drinks    Comment: occasional  . Drug use: No  . Sexual activity: Not on file  Lifestyle  . Physical activity:    Days per week: Not on file    Minutes per session: Not on file  . Stress: Not on file  Relationships  . Social connections:    Talks on phone: Not on file    Gets together:  Not on file    Attends religious service: Not on file    Active member of club or organization: Not on file    Attends meetings of clubs or organizations: Not on file    Relationship status: Not on file  . Intimate partner violence:    Fear of current or ex partner: Not on file    Emotionally abused: Not on file    Physically abused: Not on file    Forced sexual activity: Not on file  Other Topics Concern  . Not on file  Social History Narrative  . Not on file     PHYSICAL EXAM  Vitals:   12/29/17 0832  BP: 107/71  Pulse: 77  Resp: 18  Weight: 214 lb (97.1 kg)  Height: 5\' 4"  (1.626 m)    Body mass index is 36.73 kg/m.   General: The patient is well-developed and well-nourished and in no acute distress.   Fundoscopic exam shows mild optic pallor on the right but no papilledema.   Neurologic Exam  Mental status: The patient is alert and oriented x 3 at the time of the examination. The patient has apparent normal recent and remote memory, with an apparently normal attention span and concentration ability.   Speech is normal.  Cranial nerves: Extraocular movements are full. Pupils show 1+ right APD.   Facial strength and sensation is normal. Trapezius strength is normal. No dysarthria is noted.  The tongue is midline, and the patient has symmetric elevation of the soft palate.  No obvious hearing deficits are noted.  Motor:  Muscle bulk is normal.   Tone is normal. Strength is  5 / 5 in all 4 extremities.   Sensory: Intact touch and vibration sensation  Coordination: Cerebellar testing reveals good finger-nose-finger bilaterally.  Gait and station: Station is normal.   Normal gait.   Near normal tandem gait.   Romberg is negative.   Reflexes: Deep tendon reflexes are symmetric and normal bilaterally.       DIAGNOSTIC DATA (LABS, IMAGING, TESTING) - I reviewed patient records, labs, notes, testing and imaging myself where available.     ASSESSMENT AND PLAN  Multiple sclerosis (HCC)  Gait disturbance  Other fatigue  Bilateral hearing loss, unspecified hearing loss type   1.   Continue Glatiramer injections.   We had a discussion that some time in the future may consider stopping the DMT.    Consider MRi around time of next visit to determine if any subclinical activity.  2.   Stray active and exercise 3.   She will return to see me in 12 months but call sooner if she has any new or worsening neurologic symptoms.    Richard A. Epimenio Foot, MD, PhD 12/29/2017, 9:11 AM Certified in Neurology, Clinical Neurophysiology, Sleep Medicine, Pain Medicine and Neuroimaging  [facility] [address] [place], [place] [address] [phone]

## 2018-03-12 DIAGNOSIS — Z01419 Encounter for gynecological examination (general) (routine) without abnormal findings: Secondary | ICD-10-CM | POA: Diagnosis not present

## 2018-03-14 ENCOUNTER — Other Ambulatory Visit: Payer: Self-pay | Admitting: Obstetrics and Gynecology

## 2018-03-14 DIAGNOSIS — Z1231 Encounter for screening mammogram for malignant neoplasm of breast: Secondary | ICD-10-CM

## 2018-04-02 DIAGNOSIS — H2513 Age-related nuclear cataract, bilateral: Secondary | ICD-10-CM | POA: Diagnosis not present

## 2018-04-02 DIAGNOSIS — H401122 Primary open-angle glaucoma, left eye, moderate stage: Secondary | ICD-10-CM | POA: Diagnosis not present

## 2018-04-02 DIAGNOSIS — Z9883 Filtering (vitreous) bleb after glaucoma surgery status: Secondary | ICD-10-CM | POA: Diagnosis not present

## 2018-04-02 DIAGNOSIS — H401113 Primary open-angle glaucoma, right eye, severe stage: Secondary | ICD-10-CM | POA: Diagnosis not present

## 2018-04-13 DIAGNOSIS — Z9883 Filtering (vitreous) bleb after glaucoma surgery status: Secondary | ICD-10-CM | POA: Diagnosis not present

## 2018-04-13 DIAGNOSIS — H401133 Primary open-angle glaucoma, bilateral, severe stage: Secondary | ICD-10-CM | POA: Diagnosis not present

## 2018-04-13 DIAGNOSIS — H2513 Age-related nuclear cataract, bilateral: Secondary | ICD-10-CM | POA: Diagnosis not present

## 2018-04-23 DIAGNOSIS — K219 Gastro-esophageal reflux disease without esophagitis: Secondary | ICD-10-CM | POA: Diagnosis not present

## 2018-04-23 DIAGNOSIS — I10 Essential (primary) hypertension: Secondary | ICD-10-CM | POA: Diagnosis not present

## 2018-04-23 DIAGNOSIS — Z6837 Body mass index (BMI) 37.0-37.9, adult: Secondary | ICD-10-CM | POA: Diagnosis not present

## 2018-04-23 DIAGNOSIS — E669 Obesity, unspecified: Secondary | ICD-10-CM | POA: Diagnosis not present

## 2018-04-23 DIAGNOSIS — Z87891 Personal history of nicotine dependence: Secondary | ICD-10-CM | POA: Diagnosis not present

## 2018-04-23 DIAGNOSIS — G35 Multiple sclerosis: Secondary | ICD-10-CM | POA: Diagnosis not present

## 2018-04-23 DIAGNOSIS — Z79899 Other long term (current) drug therapy: Secondary | ICD-10-CM | POA: Diagnosis not present

## 2018-04-23 DIAGNOSIS — H401123 Primary open-angle glaucoma, left eye, severe stage: Secondary | ICD-10-CM | POA: Diagnosis not present

## 2018-04-27 ENCOUNTER — Ambulatory Visit
Admission: RE | Admit: 2018-04-27 | Discharge: 2018-04-27 | Disposition: A | Discharge status: Home / Self Care | Payer: BLUE CROSS/BLUE SHIELD | Source: Ambulatory Visit | Attending: Obstetrics and Gynecology | Admitting: Obstetrics and Gynecology

## 2018-04-27 DIAGNOSIS — Z1231 Encounter for screening mammogram for malignant neoplasm of breast: Secondary | ICD-10-CM

## 2018-05-30 DIAGNOSIS — Z9889 Other specified postprocedural states: Secondary | ICD-10-CM | POA: Diagnosis not present

## 2018-05-30 DIAGNOSIS — H401123 Primary open-angle glaucoma, left eye, severe stage: Secondary | ICD-10-CM | POA: Diagnosis not present

## 2018-05-30 DIAGNOSIS — T8183XA Persistent postprocedural fistula, initial encounter: Secondary | ICD-10-CM | POA: Diagnosis not present

## 2018-05-30 DIAGNOSIS — H4440 Unspecified hypotony of eye: Secondary | ICD-10-CM | POA: Diagnosis not present

## 2018-05-30 DIAGNOSIS — G35 Multiple sclerosis: Secondary | ICD-10-CM | POA: Diagnosis not present

## 2018-05-30 DIAGNOSIS — I10 Essential (primary) hypertension: Secondary | ICD-10-CM | POA: Diagnosis not present

## 2018-05-30 DIAGNOSIS — H5989 Other postprocedural complications and disorders of eye and adnexa, not elsewhere classified: Secondary | ICD-10-CM | POA: Diagnosis not present

## 2018-05-30 DIAGNOSIS — H44432 Hypotony of eye due to other ocular disorders, left eye: Secondary | ICD-10-CM | POA: Diagnosis not present

## 2018-05-30 DIAGNOSIS — Z87891 Personal history of nicotine dependence: Secondary | ICD-10-CM | POA: Diagnosis not present

## 2018-05-30 DIAGNOSIS — Z79899 Other long term (current) drug therapy: Secondary | ICD-10-CM | POA: Diagnosis not present

## 2018-05-31 DIAGNOSIS — H401122 Primary open-angle glaucoma, left eye, moderate stage: Secondary | ICD-10-CM | POA: Diagnosis not present

## 2018-05-31 DIAGNOSIS — H2513 Age-related nuclear cataract, bilateral: Secondary | ICD-10-CM | POA: Diagnosis not present

## 2018-05-31 DIAGNOSIS — H401113 Primary open-angle glaucoma, right eye, severe stage: Secondary | ICD-10-CM | POA: Diagnosis not present

## 2018-06-11 DIAGNOSIS — H401123 Primary open-angle glaucoma, left eye, severe stage: Secondary | ICD-10-CM | POA: Diagnosis not present

## 2018-06-11 DIAGNOSIS — Z6837 Body mass index (BMI) 37.0-37.9, adult: Secondary | ICD-10-CM | POA: Diagnosis not present

## 2018-06-11 DIAGNOSIS — Z79899 Other long term (current) drug therapy: Secondary | ICD-10-CM | POA: Diagnosis not present

## 2018-06-11 DIAGNOSIS — H401122 Primary open-angle glaucoma, left eye, moderate stage: Secondary | ICD-10-CM | POA: Diagnosis not present

## 2018-06-11 DIAGNOSIS — I10 Essential (primary) hypertension: Secondary | ICD-10-CM | POA: Diagnosis not present

## 2018-06-11 DIAGNOSIS — Z9889 Other specified postprocedural states: Secondary | ICD-10-CM | POA: Diagnosis not present

## 2018-06-11 DIAGNOSIS — Z87891 Personal history of nicotine dependence: Secondary | ICD-10-CM | POA: Diagnosis not present

## 2018-06-11 DIAGNOSIS — H401113 Primary open-angle glaucoma, right eye, severe stage: Secondary | ICD-10-CM | POA: Diagnosis not present

## 2018-06-11 DIAGNOSIS — H5989 Other postprocedural complications and disorders of eye and adnexa, not elsewhere classified: Secondary | ICD-10-CM | POA: Diagnosis not present

## 2018-06-11 DIAGNOSIS — E669 Obesity, unspecified: Secondary | ICD-10-CM | POA: Diagnosis not present

## 2018-06-11 DIAGNOSIS — H469 Unspecified optic neuritis: Secondary | ICD-10-CM | POA: Diagnosis not present

## 2018-06-11 DIAGNOSIS — H2513 Age-related nuclear cataract, bilateral: Secondary | ICD-10-CM | POA: Diagnosis not present

## 2018-06-11 DIAGNOSIS — G35 Multiple sclerosis: Secondary | ICD-10-CM | POA: Diagnosis not present

## 2018-06-28 ENCOUNTER — Telehealth: Payer: Self-pay | Admitting: Neurology

## 2018-06-28 MED ORDER — GLATIRAMER ACETATE 40 MG/ML ~~LOC~~ SOSY: 40.0000 mg | PREFILLED_SYRINGE | SUBCUTANEOUS | 3 refills | Status: DC

## 2018-06-28 NOTE — Telephone Encounter (Signed)
E-scribed rx to pharmacy as requested.  

## 2018-06-28 NOTE — Telephone Encounter (Signed)
Pt has new insurance and needs Glatiramer Acetate 40 MG/ML SOSY sent to [facility]. [employer]: effective 03/02/18 Member Name: Taymar Bishop ID# [id number] GRP# [id number] RX BIN# [id number]  RX GRP# none PCN# none

## 2018-06-29 ENCOUNTER — Telehealth: Payer: Self-pay | Admitting: *Deleted

## 2018-06-29 NOTE — Telephone Encounter (Signed)
PA for Glatiramer 40mg /ml completed via CoverMyMeds. Key# [id number]. Dx: RRMS (G35). Tried/failed meds: Avonex (intolerance), Tecfidera (low lymphocytes), Copaxone (insurance stopped covering)/fim

## 2018-07-02 DIAGNOSIS — E7800 Pure hypercholesterolemia, unspecified: Secondary | ICD-10-CM | POA: Diagnosis not present

## 2018-07-02 DIAGNOSIS — I10 Essential (primary) hypertension: Secondary | ICD-10-CM | POA: Diagnosis not present

## 2018-07-02 DIAGNOSIS — G35 Multiple sclerosis: Secondary | ICD-10-CM | POA: Diagnosis not present

## 2018-07-02 DIAGNOSIS — R7303 Prediabetes: Secondary | ICD-10-CM | POA: Diagnosis not present

## 2018-07-09 NOTE — Telephone Encounter (Signed)
Per CoverMyMeds, Glatiramer PA approved by [employer], for dates [employer]. No PA#/fim

## 2018-09-25 DIAGNOSIS — Z83511 Family history of glaucoma: Secondary | ICD-10-CM | POA: Diagnosis not present

## 2018-09-25 DIAGNOSIS — H401133 Primary open-angle glaucoma, bilateral, severe stage: Secondary | ICD-10-CM | POA: Diagnosis not present

## 2018-09-25 DIAGNOSIS — H25813 Combined forms of age-related cataract, bilateral: Secondary | ICD-10-CM | POA: Diagnosis not present

## 2018-09-25 DIAGNOSIS — Z8669 Personal history of other diseases of the nervous system and sense organs: Secondary | ICD-10-CM | POA: Diagnosis not present

## 2019-01-04 ENCOUNTER — Ambulatory Visit: Payer: Managed Care, Other (non HMO) | Admitting: Neurology

## 2019-01-08 DIAGNOSIS — H401133 Primary open-angle glaucoma, bilateral, severe stage: Secondary | ICD-10-CM | POA: Diagnosis not present

## 2019-01-08 DIAGNOSIS — H2513 Age-related nuclear cataract, bilateral: Secondary | ICD-10-CM | POA: Diagnosis not present

## 2019-01-10 DIAGNOSIS — I10 Essential (primary) hypertension: Secondary | ICD-10-CM | POA: Diagnosis not present

## 2019-01-10 DIAGNOSIS — R7303 Prediabetes: Secondary | ICD-10-CM | POA: Diagnosis not present

## 2019-01-10 DIAGNOSIS — G35 Multiple sclerosis: Secondary | ICD-10-CM | POA: Diagnosis not present

## 2019-01-10 DIAGNOSIS — E7800 Pure hypercholesterolemia, unspecified: Secondary | ICD-10-CM | POA: Diagnosis not present

## 2019-02-05 ENCOUNTER — Ambulatory Visit: Payer: BC Managed Care – PPO | Admitting: Neurology

## 2019-02-05 ENCOUNTER — Encounter: Payer: Self-pay | Admitting: Neurology

## 2019-02-05 ENCOUNTER — Other Ambulatory Visit: Payer: Self-pay

## 2019-02-05 VITALS — BP 110/70 | HR 81 | Temp 97.3°F | Ht 64.0 in | Wt 222.8 lb

## 2019-02-05 DIAGNOSIS — R5383 Other fatigue: Secondary | ICD-10-CM | POA: Diagnosis not present

## 2019-02-05 DIAGNOSIS — G35 Multiple sclerosis: Secondary | ICD-10-CM

## 2019-02-05 DIAGNOSIS — R269 Unspecified abnormalities of gait and mobility: Secondary | ICD-10-CM | POA: Diagnosis not present

## 2019-02-05 MED ORDER — ALPRAZOLAM 0.5 MG PO TABS: ORAL_TABLET | ORAL | 0 refills | Status: DC

## 2019-02-05 NOTE — Progress Notes (Signed)
[facility]  PATIENT: Dominique Rogers DOB: Oct 05, 1955  REFERRING DOCTOR OR PCP:  Joan MayansSofia Shapiro (fax [phone]( SOURCE: patient, records from Drs. Nile RiggsShapiro and Glade LloydPandey, labwork.  _________________________________   HISTORICAL  CHIEF COMPLAINT:  Chief Complaint  Patient presents with  . Follow-up    RM 12, alone. Last seen 12/29/2017. No new sx per pt.  . Multiple Sclerosis    On glatiramer, tolerating well with no issues.     HISTORY OF PRESENT ILLNESS:  Dominique CaoSandra Rogers is a 63 y.o. woman who was diagnosed with MS in 2004.   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   She is on generic Copaxone.   She tolerates it well.   No exacerbations or new symptoms.   She is doing ginger shots which has helped focus.    Her last exacerbation  She is walking well and is actually walking more during Covid-19.    She can climb stairs well.    She has no numbness, weakness or ataxia.     Vision is doing well.   Bladder function is good.     She denies much fatigue.   She sleeps well.  She feels mood is doing ok.     She denies issues with cognition.       Update 12/29/2017: She is doing well and has no exacerbations..   She is on generic Copaxone.   She tolerates it well.     She is walking well.   No problems with stairs.    She denies numbness or weakness.  No dysesthesia.  Bladder is doing well and vision is doing well.   No sequelae of optic neuritis.   Mild reduced hering bilaterally is unchanged  She feels her fatigue improved with a change in jobs and less travel.   She works out of home mostly now.   She is trying to exercise more.      She is sleeping well.   No depression .  Cognition is doing well.     Last MRI was 05/05/2017 and showed classic MS lesions and a partially empty sdella turcica (she does not have papilledema).  Her weight was more when younger.    We personally reviewed the images.      From 12/29/2016:  MS:   She is on Copaxone and has done well. MRI 05/2016 showed classic MS lesions and no new/acute foci Hearing Loss:   She reports that over the past year, she has had mild hearing loss.   She has trouble hearing people and neds to turn the volume up.     She denies any vertigo.     Gait/strength/sensation:    She feels her gait is doing well she occasionally feels slightly off balance.    She denies any significant difficulty with weakness or numbness. There are no uncomfortable dysesthetic sensations.  Bladder/bowel: In the past, she has urinary frequency and urgency. She never had incontinence. She was once on a medication but no longer needs to take anything as the frequency has improved. Denies any bowel dysfunction.  Vision:   She has a history of optic neurit but recovered well.   She denies any diplopia or eye pain.  Fatigue/sleep: She has mild physical physical and cognitive fatigue that is worse as the day goes on and will also worsen with heat.   She usually sleeps well. She occasionally will take a melatonin.   She has  mild sleep maintenance insomnia some nights.   Mood/cognition: She denies depression and anxiety. She has not had any significant issues with focus and attention. At times, she feels her memory is not as good as it used to be.   She works for [employer] as a Psychologist, educational  MS history: She was diagnosed in 2004 after presenting with right optic neuritis.   She saw her ophthalmologist who ordered an MRI. The images were consistent with multiple sclerosis and she was referred to Dr. Vito Backers in [place]. He did not think that she needed to have a lumbar puncture. She was started on Avonex initially did very well at some point she switch to another doctor, Dr. Glade Lloyd in [place]. She was having mild side effects with Avonex and MRI showed mild progression and she switched to Tecfidera. However, on Tecfidera she had low lymphocyte counts and it was discontinued.  She  was started on Copaxone about 2015.  MRI of the brain 05/05/2016 did not show any new lesions.  REVIEW OF SYSTEMS: Constitutional: No fevers, chills, sweats, or change in appetite.  She reports fatigue.   Insomnia is rare now Eyes: No visual changes, double vision, eye pain Ear, nose and throat: No hearing loss, ear pain, nasal congestion, sore throat Cardiovascular: No chest pain, palpitations Respiratory: No shortness of breath at rest or with exertion.   No wheezes GastrointestinaI: No nausea, vomiting, diarrhea, abdominal pain, fecal incontinence Genitourinary: No dysuria, urinary retention or frequency.  No nocturia. Musculoskeletal: No neck pain, back pain Integumentary: No rash, pruritus, skin lesions Psych:   No depression or anxiety Endocrine: No palpitations, diaphoresis, change in appetite, change in weigh or increased thirst Hematologic/Lymphatic: No anemia, purpura, petechiae. Allergic/Immunologic: No itchy/runny eyes, nasal congestion, recent allergic reactions, rashes  ALLERGIES: No Known Allergies  HOME MEDICATIONS:  Current Outpatient Medications:  .  cholecalciferol (VITAMIN D) 1000 units tablet, Take 1,000 Units by mouth daily., Disp: , Rfl:  .  ezetimibe (ZETIA) 10 MG tablet, Take 10 mg by mouth daily., Disp: , Rfl:  .  Glatiramer Acetate 40 MG/ML SOSY, Inject 40 mg into the skin 3 (three) times a week., Disp: 36 Syringe, Rfl: 3 .  losartan-hydrochlorothiazide (HYZAAR) 50-12.5 MG tablet, , Disp: , Rfl:  .  ALPRAZolam (XANAX) 0.5 MG tablet, Take 2 - 3 pills before the MRI, Disp: 3 tablet, Rfl: 0  PAST MEDICAL HISTORY: Past Medical History:  Diagnosis Date  . Hypertension   . Multiple sclerosis (HCC)   . Vision abnormalities     PAST SURGICAL HISTORY: Past Surgical History:  Procedure Laterality Date  . APPENDECTOMY    . EYE SURGERY    . OVARIAN CYST REMOVAL      FAMILY HISTORY: Family History  Problem Relation Age of Onset  . Diabetes Mother   .  Heart disease Mother   . Breast cancer Mother   . Breast cancer Sister     SOCIAL HISTORY:  Social History   Socioeconomic History  . Marital status: Married    Spouse name: Not on file  . Number of children: Not on file  . Years of education: Not on file  . Highest education level: Not on file  Occupational History  . Not on file  Social Needs  . Financial resource strain: Not on file  . Food insecurity    Worry: Not on file    Inability: Not on file  . Transportation needs    Medical: Not on file  Non-medical: Not on file  Tobacco Use  . Smoking status: Former Research scientist (life sciences)  . Smokeless tobacco: Never Used  Substance and Sexual Activity  . Alcohol use: Yes    Alcohol/week: 0.0 standard drinks    Comment: occasional  . Drug use: No  . Sexual activity: Not on file  Lifestyle  . Physical activity    Days per week: Not on file    Minutes per session: Not on file  . Stress: Not on file  Relationships  . Social Herbalist on phone: Not on file    Gets together: Not on file    Attends religious service: Not on file    Active member of club or organization: Not on file    Attends meetings of clubs or organizations: Not on file    Relationship status: Not on file  . Intimate partner violence    Fear of current or ex partner: Not on file    Emotionally abused: Not on file    Physically abused: Not on file    Forced sexual activity: Not on file  Other Topics Concern  . Not on file  Social History Narrative  . Not on file     PHYSICAL EXAM  Vitals:   02/05/19 0825  BP: 110/70  Pulse: 81  Temp: (!) 97.3 F (36.3 C)  Weight: 222 lb 12.8 oz (101.1 kg)  Height: 5\' 4"  (1.626 m)    Body mass index is 38.24 kg/m.   General: The patient is well-developed and well-nourished and in no acute distress.   Fundoscopic exam shows mild optic pallor on the right but no papilledema.   Neurologic Exam  Mental status: The patient is alert and oriented x 3 at the  time of the examination. The patient has apparent normal recent and remote memory, with an apparently normal attention span and concentration ability.   Speech is normal.  Cranial nerves: Extraocular movements are full.  She has mildly reduced color vision OD   facial strength and sensation is normal. Trapezius strength is normal. No dysarthria is noted.No obvious hearing deficits are noted.  Motor:  Muscle bulk is normal.   Tone is normal. Strength is  5 / 5 in all 4 extremities.   Sensory: Intact touch and vibration sensation  Coordination: Cerebellar testing reveals good finger-nose-finger bilaterally.  Gait and station: Station is normal.  Gait is normal.  The tandem gait is minimally wide   Romberg is negative.   Reflexes: Deep tendon reflexes are symmetric and normal bilaterally.      ASSESSMENT AND PLAN  Multiple sclerosis ([place]) - Plan: MR BRAIN WO CONTRAST  Other fatigue  Gait disturbance   1.   Continue Glatiramer injections.   We had a discussion that some time in the future may consider stopping the DMT.    Consider MRi around time of next visit to determine if any subclinical activity.  2.   Stray active and exercise 3.   She will return to see me in 12 months but call sooner if she has any new or worsening neurologic symptoms.    Jasmynn Pfalzgraf A. Felecia Shelling, MD, PhD 16/05/958, 4:54 AM Certified in Neurology, Clinical Neurophysiology, Sleep Medicine, Pain Medicine and Neuroimaging  [facility] [address] [place], [place] [address] [phone]

## 2019-02-07 ENCOUNTER — Telehealth: Payer: Self-pay | Admitting: Neurology

## 2019-02-07 NOTE — Telephone Encounter (Signed)
no to the covid questions MR Brain wo contarst Dr. Cheree Ditto Auth: [id number] (exp. 02/05/19 to 08/03/19). Patient is scheduled at [facility] for 02/20/19.

## 2019-02-20 ENCOUNTER — Other Ambulatory Visit: Payer: BC Managed Care – PPO

## 2019-02-27 ENCOUNTER — Ambulatory Visit (INDEPENDENT_AMBULATORY_CARE_PROVIDER_SITE_OTHER): Payer: BC Managed Care – PPO

## 2019-02-27 ENCOUNTER — Other Ambulatory Visit: Payer: Self-pay

## 2019-02-27 DIAGNOSIS — G35 Multiple sclerosis: Secondary | ICD-10-CM | POA: Diagnosis not present

## 2019-02-28 DIAGNOSIS — Z23 Encounter for immunization: Secondary | ICD-10-CM | POA: Diagnosis not present

## 2019-02-28 DIAGNOSIS — R7303 Prediabetes: Secondary | ICD-10-CM | POA: Diagnosis not present

## 2019-02-28 DIAGNOSIS — E7800 Pure hypercholesterolemia, unspecified: Secondary | ICD-10-CM | POA: Diagnosis not present

## 2019-03-04 ENCOUNTER — Telehealth: Payer: Self-pay | Admitting: *Deleted

## 2019-03-04 NOTE — Telephone Encounter (Signed)
Called and spoke with pt about MRI results. Pt verbalized understanding.

## 2019-03-04 NOTE — Telephone Encounter (Signed)
-----   Message from Britt Bottom, MD sent at 03/01/2019  1:54 PM EDT ----- Please let the patient know that the MRI looked good --- no new lesions

## 2019-04-02 ENCOUNTER — Other Ambulatory Visit (HOSPITAL_COMMUNITY)
Admission: RE | Admit: 2019-04-02 | Discharge: 2019-04-02 | Disposition: A | Discharge status: Home / Self Care | Payer: BC Managed Care – PPO | Source: Ambulatory Visit | Attending: Obstetrics and Gynecology | Admitting: Obstetrics and Gynecology

## 2019-04-02 ENCOUNTER — Other Ambulatory Visit: Payer: Self-pay | Admitting: Obstetrics and Gynecology

## 2019-04-02 ENCOUNTER — Other Ambulatory Visit: Payer: Self-pay

## 2019-04-02 DIAGNOSIS — N644 Mastodynia: Secondary | ICD-10-CM

## 2019-04-02 DIAGNOSIS — Z01419 Encounter for gynecological examination (general) (routine) without abnormal findings: Secondary | ICD-10-CM | POA: Insufficient documentation

## 2019-04-04 LAB — CYTOLOGY - PAP
Comment: NEGATIVE
Diagnosis: NEGATIVE
High risk HPV: NEGATIVE

## 2019-04-11 ENCOUNTER — Other Ambulatory Visit: Payer: BLUE CROSS/BLUE SHIELD

## 2019-04-15 ENCOUNTER — Ambulatory Visit
Admission: RE | Admit: 2019-04-15 | Discharge: 2019-04-15 | Disposition: A | Discharge status: Home / Self Care | Payer: BC Managed Care – PPO | Source: Ambulatory Visit | Attending: Obstetrics and Gynecology | Admitting: Obstetrics and Gynecology

## 2019-04-15 ENCOUNTER — Other Ambulatory Visit: Payer: Self-pay

## 2019-04-15 ENCOUNTER — Telehealth: Payer: Self-pay | Admitting: *Deleted

## 2019-04-15 ENCOUNTER — Ambulatory Visit: Payer: BLUE CROSS/BLUE SHIELD

## 2019-04-15 DIAGNOSIS — N644 Mastodynia: Secondary | ICD-10-CM

## 2019-04-15 NOTE — Telephone Encounter (Signed)
Called, LVM for pt (ok per DPR) to let her know [facility] has been trying to reach her to schedule her next delivery of glatiramer. Asked her to call them back at her earliest convenience at [phone].

## 2019-04-29 ENCOUNTER — Other Ambulatory Visit: Payer: Self-pay

## 2019-04-29 ENCOUNTER — Ambulatory Visit
Admission: RE | Admit: 2019-04-29 | Discharge: 2019-04-29 | Disposition: A | Discharge status: Home / Self Care | Payer: BC Managed Care – PPO | Source: Ambulatory Visit | Attending: Obstetrics and Gynecology | Admitting: Obstetrics and Gynecology

## 2019-04-29 DIAGNOSIS — R921 Mammographic calcification found on diagnostic imaging of breast: Secondary | ICD-10-CM | POA: Diagnosis not present

## 2019-04-29 DIAGNOSIS — N644 Mastodynia: Secondary | ICD-10-CM | POA: Diagnosis not present

## 2019-04-29 DIAGNOSIS — N6002 Solitary cyst of left breast: Secondary | ICD-10-CM | POA: Diagnosis not present

## 2019-06-11 ENCOUNTER — Other Ambulatory Visit: Payer: Self-pay | Admitting: *Deleted

## 2019-06-11 DIAGNOSIS — G35 Multiple sclerosis: Secondary | ICD-10-CM

## 2019-06-11 MED ORDER — GLATIRAMER ACETATE 40 MG/ML ~~LOC~~ SOSY: 40.0000 mg | PREFILLED_SYRINGE | SUBCUTANEOUS | 3 refills | Status: DC

## 2019-07-01 ENCOUNTER — Ambulatory Visit: Payer: BC Managed Care – PPO | Attending: Internal Medicine

## 2019-07-01 DIAGNOSIS — Z23 Encounter for immunization: Secondary | ICD-10-CM | POA: Insufficient documentation

## 2019-07-01 NOTE — Progress Notes (Signed)
   Covid-19 Vaccination Clinic  Name:  Dominique Rogers    MRN: [id number] DOB: 1956-02-14  07/01/2019  [name] was observed post Covid-19 immunization for 15 minutes without incidence. She was provided with Vaccine Information Sheet and instruction to access the V-Safe system.   [name] was instructed to call 911 with any severe reactions post vaccine: Marland Kitchen Difficulty breathing  . Swelling of your face and throat  . A fast heartbeat  . A bad rash all over your body  . Dizziness and weakness    Immunizations Administered    Name Date Dose VIS Date Route   Pfizer COVID-19 Vaccine 07/01/2019  3:31 PM 0.3 mL 04/12/2019 Intramuscular   Manufacturer: [employer], [employer]   Lot: [id number]   NDC: [address]-3

## 2019-07-09 DIAGNOSIS — H401133 Primary open-angle glaucoma, bilateral, severe stage: Secondary | ICD-10-CM | POA: Diagnosis not present

## 2019-07-09 DIAGNOSIS — H2513 Age-related nuclear cataract, bilateral: Secondary | ICD-10-CM | POA: Diagnosis not present

## 2019-07-13 DIAGNOSIS — H2513 Age-related nuclear cataract, bilateral: Secondary | ICD-10-CM | POA: Diagnosis not present

## 2019-07-13 DIAGNOSIS — Z20822 Contact with and (suspected) exposure to covid-19: Secondary | ICD-10-CM | POA: Diagnosis not present

## 2019-07-15 DIAGNOSIS — H25011 Cortical age-related cataract, right eye: Secondary | ICD-10-CM | POA: Diagnosis not present

## 2019-07-15 DIAGNOSIS — Z87891 Personal history of nicotine dependence: Secondary | ICD-10-CM | POA: Diagnosis not present

## 2019-07-15 DIAGNOSIS — H401123 Primary open-angle glaucoma, left eye, severe stage: Secondary | ICD-10-CM | POA: Diagnosis not present

## 2019-07-15 DIAGNOSIS — G35 Multiple sclerosis: Secondary | ICD-10-CM | POA: Diagnosis not present

## 2019-07-15 DIAGNOSIS — H25012 Cortical age-related cataract, left eye: Secondary | ICD-10-CM | POA: Diagnosis not present

## 2019-07-15 DIAGNOSIS — Z79899 Other long term (current) drug therapy: Secondary | ICD-10-CM | POA: Diagnosis not present

## 2019-07-15 DIAGNOSIS — Z6837 Body mass index (BMI) 37.0-37.9, adult: Secondary | ICD-10-CM | POA: Diagnosis not present

## 2019-07-15 DIAGNOSIS — E669 Obesity, unspecified: Secondary | ICD-10-CM | POA: Diagnosis not present

## 2019-07-24 ENCOUNTER — Ambulatory Visit: Payer: BC Managed Care – PPO | Attending: Internal Medicine

## 2019-07-24 DIAGNOSIS — Z23 Encounter for immunization: Secondary | ICD-10-CM

## 2019-07-24 NOTE — Progress Notes (Signed)
   Covid-19 Vaccination Clinic  Name:  Dominique Rogers    MRN: [id number] DOB: 02-May-1956  07/24/2019  [name] was observed post Covid-19 immunization for 15 minutes without incident. She was provided with Vaccine Information Sheet and instruction to access the V-Safe system.   [name] was instructed to call 911 with any severe reactions post vaccine: Marland Kitchen Difficulty breathing  . Swelling of face and throat  . A fast heartbeat  . A bad rash all over body  . Dizziness and weakness   Immunizations Administered    Name Date Dose VIS Date Route   Pfizer COVID-19 Vaccine 07/24/2019 12:54 PM 0.3 mL 04/12/2019 Intramuscular   Manufacturer: [employer], [employer]   Lot: [id number]   NDC: [address]-5

## 2019-09-06 DIAGNOSIS — Z20822 Contact with and (suspected) exposure to covid-19: Secondary | ICD-10-CM | POA: Diagnosis not present

## 2019-09-06 DIAGNOSIS — Z01812 Encounter for preprocedural laboratory examination: Secondary | ICD-10-CM | POA: Diagnosis not present

## 2019-09-09 DIAGNOSIS — Y848 Other medical procedures as the cause of abnormal reaction of the patient, or of later complication, without mention of misadventure at the time of the procedure: Secondary | ICD-10-CM | POA: Diagnosis not present

## 2019-09-09 DIAGNOSIS — H5989 Other postprocedural complications and disorders of eye and adnexa, not elsewhere classified: Secondary | ICD-10-CM | POA: Diagnosis not present

## 2019-09-09 DIAGNOSIS — Z9883 Filtering (vitreous) bleb after glaucoma surgery status: Secondary | ICD-10-CM | POA: Diagnosis not present

## 2019-09-09 DIAGNOSIS — Z791 Long term (current) use of non-steroidal anti-inflammatories (NSAID): Secondary | ICD-10-CM | POA: Diagnosis not present

## 2019-09-09 DIAGNOSIS — H25011 Cortical age-related cataract, right eye: Secondary | ICD-10-CM | POA: Diagnosis not present

## 2019-09-09 DIAGNOSIS — Z87891 Personal history of nicotine dependence: Secondary | ICD-10-CM | POA: Diagnosis not present

## 2019-09-09 DIAGNOSIS — H401113 Primary open-angle glaucoma, right eye, severe stage: Secondary | ICD-10-CM | POA: Diagnosis not present

## 2019-09-09 DIAGNOSIS — Z79899 Other long term (current) drug therapy: Secondary | ICD-10-CM | POA: Diagnosis not present

## 2019-09-09 DIAGNOSIS — H2511 Age-related nuclear cataract, right eye: Secondary | ICD-10-CM | POA: Diagnosis not present

## 2019-09-09 DIAGNOSIS — Z9889 Other specified postprocedural states: Secondary | ICD-10-CM | POA: Diagnosis not present

## 2020-02-03 ENCOUNTER — Ambulatory Visit (INDEPENDENT_AMBULATORY_CARE_PROVIDER_SITE_OTHER): Payer: BC Managed Care – PPO | Admitting: Neurology

## 2020-02-03 ENCOUNTER — Encounter: Payer: Self-pay | Admitting: Neurology

## 2020-02-03 VITALS — BP 122/77 | HR 72 | Ht 64.0 in | Wt 215.5 lb

## 2020-02-03 DIAGNOSIS — G35 Multiple sclerosis: Secondary | ICD-10-CM | POA: Diagnosis not present

## 2020-02-03 DIAGNOSIS — R269 Unspecified abnormalities of gait and mobility: Secondary | ICD-10-CM

## 2020-02-03 DIAGNOSIS — Z8669 Personal history of other diseases of the nervous system and sense organs: Secondary | ICD-10-CM | POA: Diagnosis not present

## 2020-02-03 NOTE — Progress Notes (Signed)
[facility]  PATIENT: Dominique Rogers DOB: 02/07/1956  REFERRING DOCTOR OR PCP:  Joan Mayans (fax [phone]( SOURCE: patient, records from Drs. Nile Riggs and Glade Lloyd, labwork.  _________________________________   HISTORICAL  CHIEF COMPLAINT:  Chief Complaint  Patient presents with  . Follow-up    RM 13, alone. Last seen 02/05/19. No new sx.   . Multiple Sclerosis    On glatiramer. Tolerating well. Wants to discuss how long she should stay on this.    HISTORY OF PRESENT ILLNESS:  Dominique Rogers is a 64 y.o. woman who was diagnosed with relapsing remitting MS in 2004.   Update 02/03/2020: She is on glatiramer acetate.   She has no recent exacerbation (last one at 2004 diagnosis with right ON).  I reviewed MRI from 02/03/2014, 04/23/2015, 05/2016 and 02/27/2019 and they are unchanged with an overall small plaque burden.      She is walking well and is actually walks some daily.    She can climb stairs well.    She has no numbness, weakness or ataxia.     Vision is doing well.   She has recent cataract surgery.    She denies difficulty with color vision.  Bladder function is good.     She denies much fatigue.   She sleeps well.  She feels mood is doing ok.     She denies issues with cognition.     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     MS history: She was diagnosed in 2004 after presenting with right optic neuritis.   She saw her ophthalmologist who ordered an MRI. The images were consistent with multiple sclerosis and she was referred to Dr. Vito Backers in [place]. He did not think that she needed to have a lumbar puncture. She was started on Avonex initially did very well at some point she switch to another doctor, Dr. Glade Lloyd in [place]. She was having mild side effects with Avonex and MRI showed mild progression and she switched to Tecfidera. However, on Tecfidera she had  low lymphocyte counts and it was discontinued.  She was started on Copaxone about 2015.    MRI of the brain 05/05/2016 did not show any new lesions compared to 04/23/2015  MRI 02/27/2019 no new lesions.  Overall small plaque burden    REVIEW OF SYSTEMS: Constitutional: No fevers, chills, sweats, or change in appetite.  She reports fatigue.   Insomnia is rare now Eyes: No visual changes, double vision, eye pain Ear, nose and throat: No hearing loss, ear pain, nasal congestion, sore throat Cardiovascular: No chest pain, palpitations Respiratory: No shortness of breath at rest or with exertion.   No wheezes GastrointestinaI: No nausea, vomiting, diarrhea, abdominal pain, fecal incontinence Genitourinary: No dysuria, urinary retention or frequency.  No nocturia. Musculoskeletal: No neck pain, back pain Integumentary: No rash, pruritus, skin lesions Psych:   No depression or anxiety Endocrine: No palpitations, diaphoresis, change in appetite, change in weigh or increased thirst Hematologic/Lymphatic: No anemia, purpura, petechiae. Allergic/Immunologic: No itchy/runny eyes, nasal congestion, recent allergic reactions, rashes  ALLERGIES: No Known Allergies  HOME MEDICATIONS:  Current Outpatient Medications:  .  ALPRAZolam (XANAX) 0.5 MG tablet, Take 2 - 3 pills before the MRI, Disp: 3 tablet, Rfl: 0 .  cholecalciferol (VITAMIN D) 1000 units tablet, Take 1,000 Units by mouth daily., Disp: , Rfl:  .  ezetimibe (ZETIA) 10 MG tablet, Take 10  mg by mouth daily., Disp: , Rfl:  .  Glatiramer Acetate 40 MG/ML SOSY, Inject 40 mg into the skin 3 (three) times a week., Disp: 36 mL, Rfl: 3 .  losartan-hydrochlorothiazide (HYZAAR) 50-12.5 MG tablet, , Disp: , Rfl:   PAST MEDICAL HISTORY: Past Medical History:  Diagnosis Date  . Hypertension   . Multiple sclerosis (HCC)   . Vision abnormalities     PAST SURGICAL HISTORY: Past Surgical History:  Procedure Laterality Date  . APPENDECTOMY      . EYE SURGERY    . OVARIAN CYST REMOVAL      FAMILY HISTORY: Family History  Problem Relation Age of Onset  . Diabetes Mother   . Heart disease Mother   . Breast cancer Mother   . Breast cancer Sister     SOCIAL HISTORY:  Social History   Socioeconomic History  . Marital status: Married    Spouse name: Not on file  . Number of children: Not on file  . Years of education: Not on file  . Highest education level: Not on file  Occupational History  . Not on file  Tobacco Use  . Smoking status: Former Games developer  . Smokeless tobacco: Never Used  Substance and Sexual Activity  . Alcohol use: Yes    Alcohol/week: 0.0 standard drinks    Comment: occasional  . Drug use: No  . Sexual activity: Not on file  Other Topics Concern  . Not on file  Social History Narrative  . Not on file   Social Determinants of Health   Financial Resource Strain:   . Difficulty of Paying Living Expenses: Not on file  Food Insecurity:   . Worried About Programme researcher, broadcasting/film/video in the Last Year: Not on file  . Ran Out of Food in the Last Year: Not on file  Transportation Needs:   . Lack of Transportation (Medical): Not on file  . Lack of Transportation (Non-Medical): Not on file  Physical Activity:   . Days of Exercise per Week: Not on file  . Minutes of Exercise per Session: Not on file  Stress:   . Feeling of Stress : Not on file  Social Connections:   . Frequency of Communication with Friends and Family: Not on file  . Frequency of Social Gatherings with Friends and Family: Not on file  . Attends Religious Services: Not on file  . Active Member of Clubs or Organizations: Not on file  . Attends Banker Meetings: Not on file  . Marital Status: Not on file  Intimate Partner Violence:   . Fear of Current or Ex-Partner: Not on file  . Emotionally Abused: Not on file  . Physically Abused: Not on file  . Sexually Abused: Not on file     PHYSICAL EXAM  Vitals:   02/03/20 1029   BP: 122/77  Pulse: 72  Weight: 215 lb 8 oz (97.8 kg)  Height: 5\' 4"  (1.626 m)    Body mass index is 36.99 kg/m.   General: The patient is well-developed and well-nourished and in no acute distress.   Fundoscopic exam shows mild optic pallor on the right but no papilledema.   Neurologic Exam  Mental status: The patient is alert and oriented x 3 at the time of the examination. The patient has apparent normal recent and remote memory, with an apparently normal attention span and concentration ability.   Speech is normal.  Cranial nerves: Extraocular movements are full.  She has mildly reduced  color vision OD   facial strength and sensation is normal. Trapezius strength is normal. No dysarthria is noted.No obvious hearing deficits are noted.  Motor:  Muscle bulk is normal.   Tone is normal. Strength is  5 / 5 in all 4 extremities.   Sensory: Intact touch and vibration sensation  Coordination: Cerebellar testing reveals good finger-nose-finger bilaterally.  Gait and station: Station is normal.  Gait is normal for age.  Tandem gait is minimally wide.  Romberg is negative.   Reflexes: Deep tendon reflexes are symmetric and normal bilaterally.      ASSESSMENT AND PLAN  Multiple sclerosis (HCC) - Plan: MR BRAIN WO CONTRAST  History of optic neuritis  Gait disturbance - Plan: MR BRAIN WO CONTRAST   1.   She has been stable on glatiramer injections.  She has had no exacerbations over the past 15 years and MRIs have been stable the last few times have been checked.  We discussed that due to her stability and low plaque burden that she might be able to discontinue a disease modifying therapy.  She is interested in doing so.  We will check an MRI of the brain.  If it shows no new lesions she will stop the Copaxone and we will check annual MRIs for several years to assure stability. 2.   Stray active and exercise 3.   She will return to see me in 12 months but call sooner if she has any  new or worsening neurologic symptoms.    Lalia Loudon A. Epimenio Foot, MD, PhD 02/03/2020, 4:08 PM Certified in Neurology, Clinical Neurophysiology, Sleep Medicine, Pain Medicine and Neuroimaging  [facility] [address], [address] [place], [place] [address] [phone]

## 2020-02-04 ENCOUNTER — Telehealth: Payer: Self-pay | Admitting: Neurology

## 2020-02-04 ENCOUNTER — Other Ambulatory Visit: Payer: BC Managed Care – PPO

## 2020-02-04 DIAGNOSIS — Z20822 Contact with and (suspected) exposure to covid-19: Secondary | ICD-10-CM

## 2020-02-04 NOTE — Telephone Encounter (Signed)
LVM for pt to call back about scheduling mri  [employer] Auth: [id number] (exp. 02/03/20 to 07/31/20)

## 2020-02-04 NOTE — Telephone Encounter (Signed)
I spoke to the patient she states she went this morning to get tested for Covid and is pending the results. She states she will give me a call back once she gets her results to schedule her MRI.

## 2020-02-05 LAB — NOVEL CORONAVIRUS, NAA: SARS-CoV-2, NAA: NOT DETECTED

## 2020-02-05 LAB — SARS-COV-2, NAA 2 DAY TAT

## 2020-02-06 NOTE — Telephone Encounter (Signed)
Patient called stating her covid test results are negative. She is scheduled at [facility] for 02/18/20.

## 2020-02-10 ENCOUNTER — Ambulatory Visit: Payer: BC Managed Care – PPO | Admitting: Neurology

## 2020-02-13 ENCOUNTER — Ambulatory Visit: Payer: BC Managed Care – PPO | Attending: Critical Care Medicine

## 2020-02-13 DIAGNOSIS — Z23 Encounter for immunization: Secondary | ICD-10-CM

## 2020-02-13 NOTE — Progress Notes (Signed)
   Covid-19 Vaccination Clinic  Name:  Dominique Rogers    MRN: [id number] DOB: [birth year]  02/13/2020  Dominique Rogers was observed post Covid-19 immunization for 15 minutes without incident. She was provided with Vaccine Information Sheet and instruction to access the V-Safe system.   Dominique Rogers was instructed to call 911 with any severe reactions post vaccine: Marland Kitchen Difficulty breathing  . Swelling of face and throat  . A fast heartbeat  . A bad rash all over body  . Dizziness and weakness

## 2020-02-18 ENCOUNTER — Ambulatory Visit (INDEPENDENT_AMBULATORY_CARE_PROVIDER_SITE_OTHER): Payer: BC Managed Care – PPO

## 2020-02-18 DIAGNOSIS — G35 Multiple sclerosis: Secondary | ICD-10-CM | POA: Diagnosis not present

## 2020-02-18 DIAGNOSIS — R269 Unspecified abnormalities of gait and mobility: Secondary | ICD-10-CM

## 2020-03-13 DIAGNOSIS — Z0000 Encounter for general adult medical examination without abnormal findings: Secondary | ICD-10-CM | POA: Diagnosis not present

## 2020-03-13 DIAGNOSIS — Z23 Encounter for immunization: Secondary | ICD-10-CM | POA: Diagnosis not present

## 2020-03-13 DIAGNOSIS — G35 Multiple sclerosis: Secondary | ICD-10-CM | POA: Diagnosis not present

## 2020-03-13 DIAGNOSIS — E7800 Pure hypercholesterolemia, unspecified: Secondary | ICD-10-CM | POA: Diagnosis not present

## 2020-03-13 DIAGNOSIS — R7303 Prediabetes: Secondary | ICD-10-CM | POA: Diagnosis not present

## 2020-03-16 DIAGNOSIS — Z1211 Encounter for screening for malignant neoplasm of colon: Secondary | ICD-10-CM | POA: Diagnosis not present

## 2020-03-24 DIAGNOSIS — E876 Hypokalemia: Secondary | ICD-10-CM | POA: Diagnosis not present

## 2020-03-31 DIAGNOSIS — H401133 Primary open-angle glaucoma, bilateral, severe stage: Secondary | ICD-10-CM | POA: Diagnosis not present

## 2020-04-03 DIAGNOSIS — Z01419 Encounter for gynecological examination (general) (routine) without abnormal findings: Secondary | ICD-10-CM | POA: Diagnosis not present

## 2020-04-09 ENCOUNTER — Other Ambulatory Visit: Payer: Self-pay | Admitting: Obstetrics and Gynecology

## 2020-04-09 DIAGNOSIS — Z1231 Encounter for screening mammogram for malignant neoplasm of breast: Secondary | ICD-10-CM

## 2020-04-20 DIAGNOSIS — Z01818 Encounter for other preprocedural examination: Secondary | ICD-10-CM | POA: Diagnosis not present

## 2020-04-30 ENCOUNTER — Other Ambulatory Visit: Payer: Self-pay

## 2020-04-30 ENCOUNTER — Ambulatory Visit
Admission: RE | Admit: 2020-04-30 | Discharge: 2020-04-30 | Disposition: A | Discharge status: Home / Self Care | Payer: BC Managed Care – PPO | Source: Ambulatory Visit

## 2020-04-30 DIAGNOSIS — Z1231 Encounter for screening mammogram for malignant neoplasm of breast: Secondary | ICD-10-CM

## 2020-05-08 ENCOUNTER — Other Ambulatory Visit: Payer: BC Managed Care – PPO

## 2020-05-14 DIAGNOSIS — M5412 Radiculopathy, cervical region: Secondary | ICD-10-CM | POA: Diagnosis not present

## 2020-05-28 ENCOUNTER — Ambulatory Visit: Payer: BC Managed Care – PPO | Attending: Family Medicine | Admitting: Physical Therapy

## 2020-05-28 ENCOUNTER — Other Ambulatory Visit: Payer: Self-pay

## 2020-05-28 ENCOUNTER — Encounter: Payer: Self-pay | Admitting: Physical Therapy

## 2020-05-28 DIAGNOSIS — M542 Cervicalgia: Secondary | ICD-10-CM | POA: Diagnosis not present

## 2020-05-28 DIAGNOSIS — R252 Cramp and spasm: Secondary | ICD-10-CM | POA: Diagnosis not present

## 2020-05-28 DIAGNOSIS — M546 Pain in thoracic spine: Secondary | ICD-10-CM | POA: Diagnosis not present

## 2020-05-28 NOTE — Patient Instructions (Signed)
Access Code: [email or web address] URL: [email or web address] Date: 05/28/2020 Prepared by: Lysle Rubens  Exercises Sidelying Open Book Thoracic Lumbar Rotation and Extension - 1 x daily - 7 x weekly - 3 sets - 5 reps - 5-10 sec hold Seated Cervical Retraction - 1 x daily - 7 x weekly - 3 sets - 10 reps - 3 sec hold Seated Levator Scapulae Stretch - 1 x daily - 7 x weekly - 3 sets - 2 reps - 20-30 hold Seated Upper Trapezius Stretch - 1 x daily - 7 x weekly - 3 sets - 2 reps - 20-30 sec hold Standing Lower Cervical and Upper Thoracic Stretch - 1 x daily - 7 x weekly - 3 sets - 5 reps - 10-15 sec hold

## 2020-05-28 NOTE — Therapy (Signed)
[facility] [place] [facility] [address] [place], [place], [address] Phone: [phone]   Fax:  [phone]  Physical Therapy Evaluation  Patient Details  Name: Dominique Rogers MRN: [id number] Date of Birth: 05/23/1955 Referring Provider (PT): Leta Baptist Date: 05/28/2020   PT End of Session - 05/28/20 1120    Visit Number 1    Date for PT Re-Evaluation 07/26/20    PT Start Time 0804    PT Stop Time 0844    PT Time Calculation (min) 40 min    Activity Tolerance Patient tolerated treatment well    Behavior During Therapy [facility] for tasks assessed/performed           Past Medical History:  Diagnosis Date  . Hypertension   . Multiple sclerosis (HCC)   . Vision abnormalities     Past Surgical History:  Procedure Laterality Date  . APPENDECTOMY    . EYE SURGERY    . OVARIAN CYST REMOVAL      There were no vitals filed for this visit.    Subjective Assessment - 05/28/20 0810    Subjective Pt reports that she has been having R sided cervical/thoracic pain present intermittently for months and worsening just before the holidays. Pt reports occasional N/T in R hand. Denies radiating pain in RUE but does state she has aching pain localized to radial tunnel. Pt denies changes in vision. States she does have some cervical pain that correlates with headaches.Pt reports she has had cervical xrays showing foraminal stenosis on R along with some evidence of degeneration. 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    Pertinent History RRMS (2005)    Limitations Sitting;Lifting;House hold activities    Diagnostic tests xrays cspine    Currently in Pain? Yes    Pain Score 3     Pain Location Neck    Pain Orientation Right    Pain Descriptors / Indicators Aching;Dull    Pain Type Chronic pain    Pain Onset More than a month ago    Pain Frequency Intermittent     Aggravating Factors  turning to R side, reaching behind head, turning head too quickly    Pain Relieving Factors lying down, epsom salt bath, heat, voltarin gel              [facility] PT Assessment - 05/28/20 0001      Assessment   Medical Diagnosis cervical pain    Referring Provider (PT) Modesto Charon    Hand Dominance Right    Next MD Visit --   four weeks after start of PT   Prior Therapy PT for foot      Precautions   Precautions None      Restrictions   Weight Bearing Restrictions No      Balance Screen   Has the patient fallen in the past 6 months No    Has the patient had a decrease in activity level because of a fear of falling?  No    Is the patient reluctant to leave their home because of a fear of falling?  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, office work Management consultant)    Leisure walking, exercise (elliptical), traveling      [employer]  Touch Appears Intact      Functional Tests   Functional tests Sit to Stand      Sit to Stand   Comments [facility]      Posture/Postural Control   Posture/Postural Control Postural limitations    Postural Limitations Rounded Shoulders;Forward head;Increased thoracic kyphosis    Posture Comments significant FHP with palpable tightness cervical paraspinals and B UT      ROM / Strength   AROM / PROM / Strength AROM;Strength      AROM   Overall AROM Comments B shoulder AROM WFL with no pain; lumbar AROM WFL, mild discomfort with lumbar rotation    AROM Assessment Site Cervical    Cervical Flexion 45    Cervical Extension 55    Cervical - Right Side Bend 20    Cervical - Left Side Bend 25    Cervical - Right Rotation 50    Cervical - Left Rotation 45      Strength   Overall Strength Comments BUE strength 5/5; no pain reproduction of wrist extensor pain with wrist extension MMT      Palpation   Spinal mobility hypomobility  cervical spine    Palpation comment tender to palpation R cervical paraspinals, R UT, R periscapulars, and R wrist extensors      Special Tests    Special Tests Cervical;Rotator Cuff Impingement    Cervical Tests Spurling's    Rotator Cuff Impingment tests Hawkins- Kennedy test;Painful Arc of Motion      Spurling's   Findings Positive    Side Right    Comment mild pain reproduction with R sided Spurling's      Hawkins-Kennedy test   Findings Negative    Side Right      Painful Arc of Motion   Findings Negative    Side Right                      Objective measurements completed on examination: See above findings.               PT Education - 05/28/20 1119    Education Details Pt educated on POC and HEP    Person(s) Educated Patient    Methods Explanation;Demonstration;Handout    Comprehension Verbalized understanding;Returned demonstration            PT Short Term Goals - 05/28/20 1130      PT SHORT TERM GOAL #1   Title Pt will be I with initial HEP    Time 2    Period Weeks    Status New    Target Date 06/11/20             PT Long Term Goals - 05/28/20 1130      PT LONG TERM GOAL #1   Title Pt will be I with advanced HEP    Time 6    Period Weeks    Status New    Target Date 07/09/20      PT LONG TERM GOAL #2   Title Pt will report 50% reduction in cervical/thoracic pain    Time 6    Period Weeks    Status New    Target Date 07/09/20      PT LONG TERM GOAL #3   Title Pt will demo cervical AROM WFL with no increase in cervical pain    Time 6    Period Weeks    Status New    Target Date 07/09/20  PT LONG TERM GOAL #4   Title Pt will understand posture/body mechanics    Time 6    Period Weeks    Status New    Target Date 07/09/20                  Plan - 05/28/20 1120    Clinical Impression Statement Pt presents to clinic with reports of R sided cervical/thoracic pain present for months and worsening  since the holidays. Pt denies radiating pain into RUE but does note intermittent N/T of R hand. Pt has PMH of relapsing remitting MS; states has not had an exacerbation for years and still able to perform all ADLs/stairs/etc independently. Pt has occasional cervicogenic headaches that she states are correlated to neck pain. Pt has been utilizing home cervical traction unit, which she states is helpful. Pt demos limited cervical AROM, tenderness to palpation R cervical paraspinals/periscapulars/UT, positive Spurling's on R side, and signficiant FHP/thoracic kyphosis. Pt also reports recent onset of R wrist extensor pain; no tenderness to palpation at R lat epicondyle, mild tenderness wrist extensors, full grip strength and wrist extension strength. Focused on cervical pain this rx; may work on wrist extensors in future if pain continues. 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 Pt would benefit from skilled PT to address the above impairments.    Personal Factors and Comorbidities Comorbidity 1    Comorbidities RRMS    Examination-Activity Limitations Sit;Lift    Examination-Participation Restrictions Occupation;Community Activity;Interpersonal Relationship    Stability/Clinical Decision Making Stable/Uncomplicated    Clinical Decision Making Low    Rehab Potential Good    PT Frequency 2x / week    PT Duration 6 weeks    PT Treatment/Interventions ADLs/Self Care Home Management;Electrical Stimulation;Iontophoresis 4mg /ml Dexamethasone;Moist Heat;Traction;Neuromuscular re-education;Therapeutic exercise;Therapeutic activities;Patient/family education;Manual techniques;Dry needling;Passive range of motion;Taping    PT Next Visit Plan cervical/thoracic flexibility/strengthening, scap stab, manual/modalities as indicated    PT Home Exercise Plan UT/levator stretch, thoracic open book rotation, upper thoracic/lower cervical stretch, cervical  retraction stretch    Consulted and Agree with Plan of Care Patient           Patient will benefit from skilled therapeutic intervention in order to improve the following deficits and impairments:  Increased muscle spasms,Decreased activity tolerance,Pain,Hypomobility,Impaired flexibility,Decreased strength,Postural dysfunction  Visit Diagnosis: Neck pain  Pain in thoracic spine  Cramp and spasm     Problem List Patient Active Problem List   Diagnosis Date Noted  . Hearing loss, bilateral 12/29/2016  . Multiple sclerosis (HCC) 10/20/2015  . Other fatigue 10/20/2015  . Gait disturbance 10/20/2015   10/22/2015, PT, DPT Lysle Rubens Anakaren Campion 05/28/2020, 11:31 AM  [facility] [place] [facility] [address] [place], [place], [place] Phone: [phone]   Fax:  [phone]  Name: Dariah Mcsorley MRN: Juanna Cao Date of Birth: [birth year]

## 2020-05-29 DIAGNOSIS — Z01812 Encounter for preprocedural laboratory examination: Secondary | ICD-10-CM | POA: Diagnosis not present

## 2020-06-01 ENCOUNTER — Ambulatory Visit: Payer: BC Managed Care – PPO | Admitting: Physical Therapy

## 2020-06-01 ENCOUNTER — Other Ambulatory Visit: Payer: Self-pay

## 2020-06-01 ENCOUNTER — Encounter: Payer: Self-pay | Admitting: Physical Therapy

## 2020-06-01 DIAGNOSIS — M546 Pain in thoracic spine: Secondary | ICD-10-CM

## 2020-06-01 DIAGNOSIS — R252 Cramp and spasm: Secondary | ICD-10-CM | POA: Diagnosis not present

## 2020-06-01 DIAGNOSIS — M542 Cervicalgia: Secondary | ICD-10-CM

## 2020-06-01 NOTE — Therapy (Signed)
[facility] [place] [facility] [address] [place], [place], [address] Phone: [phone]   Fax:  [phone]  Physical Therapy Treatment  Patient Details  Name: Dominique Rogers MRN: [id number] Date of Birth: 1955-05-07 Referring Provider (PT): Leta Baptist Date: 06/01/2020   PT End of Session - 06/01/20 1553    Visit Number 2    Date for PT Re-Evaluation 07/26/20    PT Start Time 1511    PT Stop Time 1600    PT Time Calculation (min) 49 min    Activity Tolerance Patient tolerated treatment well    Behavior During Therapy [facility] for tasks assessed/performed           Past Medical History:  Diagnosis Date  . Hypertension   . Multiple sclerosis (HCC)   . Vision abnormalities     Past Surgical History:  Procedure Laterality Date  . APPENDECTOMY    . EYE SURGERY    . OVARIAN CYST REMOVAL      There were no vitals filed for this visit.   Subjective Assessment - 06/01/20 1512    Subjective Doing the exercises at home, feels looser    Currently in Pain? No/denies                             [facility] Adult PT Treatment/Exercise - 06/01/20 0001      Exercises   Exercises Neck      Neck Exercises: Machines for Strengthening   UBE (Upper Arm Bike) L1 x 3 min each      Neck Exercises: Standing   Neck Retraction 20 reps;3 secs    Other Standing Exercises Rows & Ext red 2x10    Other Standing Exercises Shrugs 3lb 2x10, ER red 2x10      Neck Exercises: Supine   Shoulder Flexion Both;20 reps;Weights    Shoulder Flexion Weights (lbs) 2      Modalities   Modalities Moist Heat      Moist Heat Therapy   Number Minutes Moist Heat 10 Minutes    Moist Heat Location Cervical      Manual Therapy   Manual Therapy Soft tissue mobilization    Manual therapy comments R tissue density    Soft tissue mobilization Upper traps, rhomboids, Levator                    PT Short Term Goals - 06/01/20 1605      PT  SHORT TERM GOAL #1   Title Pt will be I with initial HEP    Status Achieved             PT Long Term Goals - 05/28/20 1130      PT LONG TERM GOAL #1   Title Pt will be I with advanced HEP    Time 6    Period Weeks    Status New    Target Date 07/09/20      PT LONG TERM GOAL #2   Title Pt will report 50% reduction in cervical/thoracic pain    Time 6    Period Weeks    Status New    Target Date 07/09/20      PT LONG TERM GOAL #3   Title Pt will demo cervical AROM WFL with no increase in cervical pain    Time 6    Period Weeks    Status New    Target Date  07/09/20      PT LONG TERM GOAL #4   Title Pt will understand posture/body mechanics    Time 6    Period Weeks    Status New    Target Date 07/09/20                 Plan - 06/01/20 1605    Clinical Impression Statement Pt enters clinic reporting decrease tightness from doing HEP. Some tissues tightness noted in the upper R trap with MT. She did well with all exercises, cues needed at times for posture and core engagement. Positive response to MHP    Personal Factors and Comorbidities Comorbidity 1    Comorbidities RRMS    Examination-Activity Limitations Sit;Lift    Examination-Participation Restrictions Occupation;Community Activity;Interpersonal Relationship    Stability/Clinical Decision Making Stable/Uncomplicated    Rehab Potential Good    PT Frequency 2x / week    PT Duration 6 weeks    PT Treatment/Interventions ADLs/Self Care Home Management;Electrical Stimulation;Iontophoresis 4mg /ml Dexamethasone;Moist Heat;Traction;Neuromuscular re-education;Therapeutic exercise;Therapeutic activities;Patient/family education;Manual techniques;Dry needling;Passive range of motion;Taping    PT Next Visit Plan cervical/thoracic flexibility/strengthening, scap stab, manual/modalities as indicated           Patient will benefit from skilled therapeutic intervention in order to improve the following deficits and  impairments:  Increased muscle spasms,Decreased activity tolerance,Pain,Hypomobility,Impaired flexibility,Decreased strength,Postural dysfunction  Visit Diagnosis: Cramp and spasm  Pain in thoracic spine  Neck pain     Problem List Patient Active Problem List   Diagnosis Date Noted  . Hearing loss, bilateral 12/29/2016  . Multiple sclerosis (HCC) 10/20/2015  . Other fatigue 10/20/2015  . Gait disturbance 10/20/2015    10/22/2015, PTA 06/01/2020, 4:17 PM  [facility] [place] [facility] [address] [place], [place], [place] Phone: [phone]   Fax:  [phone]  Name: Dominique Rogers MRN: Juanna Cao Date of Birth: 1956-03-29

## 2020-06-03 DIAGNOSIS — K5730 Diverticulosis of large intestine without perforation or abscess without bleeding: Secondary | ICD-10-CM | POA: Diagnosis not present

## 2020-06-03 DIAGNOSIS — Z8371 Family history of colonic polyps: Secondary | ICD-10-CM | POA: Diagnosis not present

## 2020-06-03 DIAGNOSIS — Z1211 Encounter for screening for malignant neoplasm of colon: Secondary | ICD-10-CM | POA: Diagnosis not present

## 2020-06-03 DIAGNOSIS — K648 Other hemorrhoids: Secondary | ICD-10-CM | POA: Diagnosis not present

## 2020-06-03 DIAGNOSIS — K635 Polyp of colon: Secondary | ICD-10-CM | POA: Diagnosis not present

## 2020-06-04 ENCOUNTER — Ambulatory Visit: Payer: BC Managed Care – PPO | Attending: Family Medicine | Admitting: Physical Therapy

## 2020-06-04 ENCOUNTER — Other Ambulatory Visit: Payer: Self-pay

## 2020-06-04 ENCOUNTER — Encounter: Payer: Self-pay | Admitting: Physical Therapy

## 2020-06-04 DIAGNOSIS — M546 Pain in thoracic spine: Secondary | ICD-10-CM | POA: Insufficient documentation

## 2020-06-04 DIAGNOSIS — M542 Cervicalgia: Secondary | ICD-10-CM | POA: Diagnosis not present

## 2020-06-04 DIAGNOSIS — R252 Cramp and spasm: Secondary | ICD-10-CM | POA: Insufficient documentation

## 2020-06-04 NOTE — Therapy (Signed)
[facility] [place] [address] [place], [place], [address] Phone: [phone]   Fax:  [phone]  Physical Therapy Treatment  Patient Details  Name: Dominique Rogers MRN: [id number] Date of Birth: [birth year] Referring Provider (PT): Leta Baptist Date: 06/04/2020   PT End of Session - 06/04/20 0858    Visit Number 3    Date for PT Re-Evaluation 07/26/20    PT Start Time 0802    PT Stop Time 0847    PT Time Calculation (min) 45 min    Activity Tolerance Patient tolerated treatment well    Behavior During Therapy [facility] for tasks assessed/performed           Past Medical History:  Diagnosis Date  . Hypertension   . Multiple sclerosis (HCC)   . Vision abnormalities     Past Surgical History:  Procedure Laterality Date  . APPENDECTOMY    . EYE SURGERY    . OVARIAN CYST REMOVAL      There were no vitals filed for this visit.   Subjective Assessment - 06/04/20 0804    Subjective Pt reports she felt a lot of pain in upper thoracic region following last tx. A little better today; states stretches are helping.    Currently in Pain? Yes    Pain Score 6     Pain Location Neck   reports closer to upper thoracic region this rx                            [facility] Adult PT Treatment/Exercise - 06/04/20 0001      Neck Exercises: Machines for Strengthening   UBE (Upper Arm Bike) L1.5 3 min each      Neck Exercises: Standing   Other Standing Exercises red scap stab 3 ways x5 B      Neck Exercises: Seated   Neck Retraction 20 reps;3 secs    Neck Retraction Limitations into ball      Modalities   Modalities Moist Heat;Electrical Stimulation      Moist Heat Therapy   Number Minutes Moist Heat 12 Minutes    Moist Heat Location Cervical      Electrical Stimulation   Electrical Stimulation Location R cervical/upper thoracic    Electrical Stimulation Action IFC    Electrical Stimulation Parameters seated     Electrical Stimulation Goals Pain      Manual Therapy   Manual Therapy Soft tissue mobilization    Manual therapy comments R tissue density    Soft tissue mobilization Upper traps, rhomboids, Levator      Neck Exercises: Stretches   Upper Trapezius Stretch Right;Left;2 reps;20 seconds    Levator Stretch Right;Left;2 reps;20 seconds    Neck Stretch 10 seconds;4 reps    Corner Stretch 2 reps;20 seconds    Other Neck Stretches open book thoracic rotation in standing at wall x5 B    Other Neck Stretches 1/2 foam roll W backs x4, thoracic extension x4; thoracic extension over chair x5                    PT Short Term Goals - 06/01/20 1605      PT SHORT TERM GOAL #1   Title Pt will be I with initial HEP    Status Achieved             PT Long Term Goals - 05/28/20 1130  PT LONG TERM GOAL #1   Title Pt will be I with advanced HEP    Time 6    Period Weeks    Status New    Target Date 07/09/20      PT LONG TERM GOAL #2   Title Pt will report 50% reduction in cervical/thoracic pain    Time 6    Period Weeks    Status New    Target Date 07/09/20      PT LONG TERM GOAL #3   Title Pt will demo cervical AROM WFL with no increase in cervical pain    Time 6    Period Weeks    Status New    Target Date 07/09/20      PT LONG TERM GOAL #4   Title Pt will understand posture/body mechanics    Time 6    Period Weeks    Status New    Target Date 07/09/20                 Plan - 06/04/20 0858    Clinical Impression Statement Pt presents to clinic with reports of increased pain in upper thoracic region following last rx. Focused majority of this session on self mobilization of upper thoracic region along with flexibility ex's. STM to R UT noted increased tissue density and pt reports tenderness to palpation. Heat and estim for pain relief at end of session.    PT Treatment/Interventions ADLs/Self Care Home Management;Electrical Stimulation;Iontophoresis 4mg /ml  Dexamethasone;Moist Heat;Traction;Neuromuscular re-education;Therapeutic exercise;Therapeutic activities;Patient/family education;Manual techniques;Dry needling;Passive range of motion;Taping    PT Next Visit Plan cervical/thoracic flexibility/strengthening, scap stab, manual/modalities as indicated    Consulted and Agree with Plan of Care Patient           Patient will benefit from skilled therapeutic intervention in order to improve the following deficits and impairments:  Increased muscle spasms,Decreased activity tolerance,Pain,Hypomobility,Impaired flexibility,Decreased strength,Postural dysfunction  Visit Diagnosis: Cramp and spasm  Pain in thoracic spine  Neck pain     Problem List Patient Active Problem List   Diagnosis Date Noted  . Hearing loss, bilateral 12/29/2016  . Multiple sclerosis (HCC) 10/20/2015  . Other fatigue 10/20/2015  . Gait disturbance 10/20/2015   10/22/2015, PT, DPT Lysle Rubens Giacomo Valone 06/04/2020, 9:02 AM  [facility] [place] [address] [place], [place], [place] Phone: [phone]   Fax:  [phone]  Name: Dominique Rogers MRN: Juanna Cao Date of Birth: Mar 25, 1956

## 2020-06-08 ENCOUNTER — Ambulatory Visit: Payer: BC Managed Care – PPO | Admitting: Physical Therapy

## 2020-06-08 ENCOUNTER — Encounter: Payer: Self-pay | Admitting: Physical Therapy

## 2020-06-08 ENCOUNTER — Other Ambulatory Visit: Payer: Self-pay

## 2020-06-08 DIAGNOSIS — R252 Cramp and spasm: Secondary | ICD-10-CM | POA: Diagnosis not present

## 2020-06-08 DIAGNOSIS — M546 Pain in thoracic spine: Secondary | ICD-10-CM

## 2020-06-08 DIAGNOSIS — M542 Cervicalgia: Secondary | ICD-10-CM | POA: Diagnosis not present

## 2020-06-08 NOTE — Therapy (Signed)
[facility] [place] [facility] [address] [place], [place], [address] Phone: [phone]   Fax:  [phone]  Physical Therapy Treatment  Patient Details  Name: Dominique Rogers MRN: [id number] Date of Birth: [birth year] Referring Provider (PT): Leta Baptist Date: 06/08/2020   PT End of Session - 06/08/20 0917    Visit Number 4    Date for PT Re-Evaluation 07/26/20    PT Start Time 0801    PT Stop Time 0841    PT Time Calculation (min) 40 min    Activity Tolerance Patient tolerated treatment well    Behavior During Therapy [facility] for tasks assessed/performed           Past Medical History:  Diagnosis Date  . Hypertension   . Multiple sclerosis (HCC)   . Vision abnormalities     Past Surgical History:  Procedure Laterality Date  . APPENDECTOMY    . EYE SURGERY    . OVARIAN CYST REMOVAL      There were no vitals filed for this visit.   Subjective Assessment - 06/08/20 0806    Subjective Doing well, had a great weekend    Currently in Pain? No/denies                             [facility] Adult PT Treatment/Exercise - 06/08/20 0001      Neck Exercises: Machines for Strengthening   Nustep L4 x 6 min each    Other Machines for Strengthening rows and lats 20# 2x10      Neck Exercises: Standing   Other Standing Exercises Rows & Ext red 2x10    Other Standing Exercises Shrugs 3lb 2x10, ER/IR red 1x10; shoulder abd/flex 1x10 3#      Neck Exercises: Stretches   Upper Trapezius Stretch Right;Left;2 reps    Lower Cervical/Upper Thoracic Stretch 5 reps;10 seconds    Other Neck Stretches open book thoracic rotation in standing at wall x5 B    Other Neck Stretches thoracic extension over chair x5 5 sec hold                    PT Short Term Goals - 06/01/20 1605      PT SHORT TERM GOAL #1   Title Pt will be I with initial HEP    Status Achieved             PT Long Term Goals - 06/08/20 0820      PT  LONG TERM GOAL #1   Title Pt will be I with advanced HEP    Time 6    Period Weeks    Status On-going      PT LONG TERM GOAL #2   Title Pt will report 50% reduction in cervical/thoracic pain    Baseline 50% better    Time 6    Period Weeks    Status Achieved      PT LONG TERM GOAL #3   Title Pt will demo cervical AROM WFL with no increase in cervical pain    Time 6    Period Weeks    Status New      PT LONG TERM GOAL #4   Title Pt will understand posture/body mechanics    Time 6    Period Weeks    Status On-going                 Plan -  06/08/20 9371    Clinical Impression Statement Pt presents to clinic reporting decreased pain overall. Progressing towards LTG with reports of 50% decreased pain overall. Introduced more strengthening ex's this rx; assess response next rx. Still with difficulty isolating upper cervical movements. No reports of increased pain with exercise.    PT Treatment/Interventions ADLs/Self Care Home Management;Electrical Stimulation;Iontophoresis 4mg /ml Dexamethasone;Moist Heat;Traction;Neuromuscular re-education;Therapeutic exercise;Therapeutic activities;Patient/family education;Manual techniques;Dry needling;Passive range of motion;Taping    PT Next Visit Plan cervical/thoracic flexibility/strengthening, scap stab, manual/modalities as indicated    Consulted and Agree with Plan of Care Patient           Patient will benefit from skilled therapeutic intervention in order to improve the following deficits and impairments:  Increased muscle spasms,Decreased activity tolerance,Pain,Hypomobility,Impaired flexibility,Decreased strength,Postural dysfunction  Visit Diagnosis: Cramp and spasm  Pain in thoracic spine  Neck pain     Problem List Patient Active Problem List   Diagnosis Date Noted  . Hearing loss, bilateral 12/29/2016  . Multiple sclerosis (HCC) 10/20/2015  . Other fatigue 10/20/2015  . Gait disturbance 10/20/2015   10/22/2015, PT, DPT Lysle Rubens Shmuel Girgis 06/08/2020, 9:19 AM  [facility] [place] [facility] [address] [place], [place], [place] Phone: [phone]   Fax:  [phone]  Name: Dominique Rogers MRN: Juanna Cao Date of Birth: 05-07-1955

## 2020-06-10 ENCOUNTER — Encounter: Payer: Self-pay | Admitting: Physical Therapy

## 2020-06-10 ENCOUNTER — Other Ambulatory Visit: Payer: Self-pay

## 2020-06-10 ENCOUNTER — Ambulatory Visit: Payer: BC Managed Care – PPO | Admitting: Physical Therapy

## 2020-06-10 DIAGNOSIS — R252 Cramp and spasm: Secondary | ICD-10-CM

## 2020-06-10 DIAGNOSIS — M546 Pain in thoracic spine: Secondary | ICD-10-CM | POA: Diagnosis not present

## 2020-06-10 DIAGNOSIS — M542 Cervicalgia: Secondary | ICD-10-CM | POA: Diagnosis not present

## 2020-06-10 NOTE — Therapy (Signed)
[facility] [address] [place], [place], [address] Phone: [phone]   Fax:  [phone]  Physical Therapy Treatment  Patient Details  Name: Dominique Rogers MRN: [id number] Date of Birth: 07-Aug-1955 Referring Provider (PT): Leta Baptist Date: 06/10/2020   PT End of Session - 06/10/20 0843    Visit Number 5    Date for PT Re-Evaluation 07/26/20    PT Start Time 0801    PT Stop Time 0845    PT Time Calculation (min) 44 min    Activity Tolerance Patient tolerated treatment well    Behavior During Therapy [facility] for tasks assessed/performed           Past Medical History:  Diagnosis Date  . Hypertension   . Multiple sclerosis (HCC)   . Vision abnormalities     Past Surgical History:  Procedure Laterality Date  . APPENDECTOMY    . EYE SURGERY    . OVARIAN CYST REMOVAL      There were no vitals filed for this visit.   Subjective Assessment - 06/10/20 0805    Subjective Pt was a little sore after last rx but back to baseline now    Currently in Pain? Yes    Pain Score 3     Pain Location Neck                             [facility] Adult PT Treatment/Exercise - 06/10/20 0001      Neck Exercises: Machines for Strengthening   Nustep L5 x 6 min    Other Machines for Strengthening rows and lats 20# 2x10      Neck Exercises: Standing   Other Standing Exercises red scap stab 3 ways x5 B    Other Standing Exercises Shrugs 3lb 2x10, ER/IR red 1x10; shoulder abd/flex 1x10 3#      Neck Exercises: Seated   Neck Retraction 10 reps;3 secs      Moist Heat Therapy   Number Minutes Moist Heat 10 Minutes    Moist Heat Location Cervical      Manual Therapy   Manual Therapy Soft tissue mobilization;Passive ROM    Manual therapy comments R tissue density    Soft tissue mobilization Upper traps, rhomboids, Levator    Passive ROM cervical ROM all directions      Neck Exercises: Stretches   Other Neck  Stretches open book thoracic rotation in standing at wall x5 B    Other Neck Stretches 1/2 foam roll W backs x4, thoracic extension x4                    PT Short Term Goals - 06/01/20 1605      PT SHORT TERM GOAL #1   Title Pt will be I with initial HEP    Status Achieved             PT Long Term Goals - 06/08/20 0820      PT LONG TERM GOAL #1   Title Pt will be I with advanced HEP    Time 6    Period Weeks    Status On-going      PT LONG TERM GOAL #2   Title Pt will report 50% reduction in cervical/thoracic pain    Baseline 50% better    Time 6    Period Weeks    Status Achieved      PT  LONG TERM GOAL #3   Title Pt will demo cervical AROM WFL with no increase in cervical pain    Time 6    Period Weeks    Status New      PT LONG TERM GOAL #4   Title Pt will understand posture/body mechanics    Time 6    Period Weeks    Status On-going                 Plan - 06/10/20 1224    Clinical Impression Statement Pt continues to report decreasing pain levels overall; soreness mildly increased this rx d/t increasing number/intensity of ex's last rx. Pt responds well to cervical/thoracic flexibility/mobilization ex's. Cuing for form with cervical retraction; difficulty isolating upper cervical movements. Decreased tissue density R UT with manual but still demos tightness/stiffness.    PT Treatment/Interventions ADLs/Self Care Home Management;Electrical Stimulation;Iontophoresis 4mg /ml Dexamethasone;Moist Heat;Traction;Neuromuscular re-education;Therapeutic exercise;Therapeutic activities;Patient/family education;Manual techniques;Dry needling;Passive range of motion;Taping    PT Next Visit Plan cervical/thoracic flexibility/strengthening, scap stab, manual/modalities as indicated    Consulted and Agree with Plan of Care Patient           Patient will benefit from skilled therapeutic intervention in order to improve the following deficits and impairments:   Increased muscle spasms,Decreased activity tolerance,Pain,Hypomobility,Impaired flexibility,Decreased strength,Postural dysfunction  Visit Diagnosis: Cramp and spasm  Pain in thoracic spine  Neck pain     Problem List Patient Active Problem List   Diagnosis Date Noted  . Hearing loss, bilateral 12/29/2016  . Multiple sclerosis (HCC) 10/20/2015  . Other fatigue 10/20/2015  . Gait disturbance 10/20/2015   10/22/2015, PT, DPT Lysle Rubens Kimberlea Schlag 06/10/2020, 8:46 AM  [facility] [address] [place], [place], [place] Phone: [phone]   Fax:  [phone]  Name: Dominique Rogers MRN: Juanna Cao Date of Birth: Apr 26, 1956

## 2020-06-17 ENCOUNTER — Other Ambulatory Visit: Payer: Self-pay

## 2020-06-17 ENCOUNTER — Ambulatory Visit: Payer: BC Managed Care – PPO | Admitting: Physical Therapy

## 2020-06-17 ENCOUNTER — Encounter: Payer: Self-pay | Admitting: Physical Therapy

## 2020-06-17 DIAGNOSIS — M542 Cervicalgia: Secondary | ICD-10-CM | POA: Diagnosis not present

## 2020-06-17 DIAGNOSIS — R252 Cramp and spasm: Secondary | ICD-10-CM | POA: Diagnosis not present

## 2020-06-17 DIAGNOSIS — M546 Pain in thoracic spine: Secondary | ICD-10-CM | POA: Diagnosis not present

## 2020-06-17 NOTE — Therapy (Signed)
[place] [place], [place], [address] Phone: [phone]   Fax:  [phone]  Physical Therapy Treatment  Patient Details  Name: Dominique Rogers MRN: [id number] Date of Birth: 1955-08-16 Referring Provider (PT): Earnest Conroy Date: 06/17/2020   PT End of Session - 06/17/20 0928    Visit Number 6    Date for PT Re-Evaluation 07/26/20    PT Start Time 0845    PT Stop Time 0927    PT Time Calculation (min) 42 min    Activity Tolerance Patient tolerated treatment well    Behavior During Therapy [facility] for tasks assessed/performed           Past Medical History:  Diagnosis Date  . Hypertension   . Multiple sclerosis ([place])   . Vision abnormalities     Past Surgical History:  Procedure Laterality Date  . APPENDECTOMY    . EYE SURGERY    . OVARIAN CYST REMOVAL      There were no vitals filed for this visit.   Subjective Assessment - 06/17/20 0848    Subjective Pt reports feeling really good overall, no pain today    Currently in Pain? No/denies    Pain Location Neck              [facility] PT Assessment - 06/17/20 0001      AROM   AROM Assessment Site Cervical    Cervical Flexion 55    Cervical Extension 55    Cervical - Right Side Bend 35    Cervical - Left Side Bend 35    Cervical - Right Rotation 55    Cervical - Left Rotation 65                         [facility] Adult PT Treatment/Exercise - 06/17/20 0001      Neck Exercises: Machines for Strengthening   UBE (Upper Arm Bike) L1.5 3 min each    Cybex Chest Press 10# 2x10 with serratus push    Other Machines for Strengthening rows and lats 20# 2x10      Neck Exercises: Standing   Other Standing Exercises red scap stab 3 ways x5 B    Other Standing Exercises Shrugs with UT stretch and shoulder rolls fwd/bkwd 3lb 1x10, ER/IR red 1x15; shoulder abd/flex 1x10 3#      Neck Exercises: Seated   Neck Retraction 20 reps;3 secs       Neck Exercises: Stretches   Other Neck Stretches open book thoracic rotation in standing at wall x5 B    Other Neck Stretches 1/2 foam roll snow angels, W backs x5, thoracic extension x5                    PT Short Term Goals - 06/01/20 1605      PT SHORT TERM GOAL #1   Title Pt will be I with initial HEP    Status Achieved             PT Long Term Goals - 06/17/20 0927      PT LONG TERM GOAL #1   Title Pt will be I with advanced HEP    Time 6    Period Weeks    Status Partially Met      PT LONG TERM GOAL #2   Title Pt will report 50% reduction in cervical/thoracic pain    Baseline 50% better  Time 6    Period Weeks    Status Achieved      PT LONG TERM GOAL #3   Title Pt will demo cervical AROM WFL with no increase in cervical pain    Baseline pt demos improved cervical ROM and subjectively states can look behind her and look up with no pain now    Time 6    Period Weeks    Status Achieved      PT LONG TERM GOAL #4   Title Pt will understand posture/body mechanics    Time 6    Period Weeks    Status Partially Met                 Plan - 06/17/20 0928    Clinical Impression Statement Pt demos improved cervical AROM in all directions and reports no pain with any cervical movements. Pt demos improved ability to isolate cervical movement with neck retraction ex's. Pt is reporting mild increased L periscapular pain this rx; instructed in a few stretches targeting this area specifically. Pt responds well to foam roll thoracic mobilizations. Potential for d/c next rx with updated HEP.    PT Treatment/Interventions ADLs/Self Care Home Management;Electrical Stimulation;Iontophoresis 17m/ml Dexamethasone;Moist Heat;Traction;Neuromuscular re-education;Therapeutic exercise;Therapeutic activities;Patient/family education;Manual techniques;Dry needling;Passive range of motion;Taping    PT Next Visit Plan cervical/thoracic flexibility/strengthening, scap stab,  manual/modalities as indicated    Consulted and Agree with Plan of Care Patient           Patient will benefit from skilled therapeutic intervention in order to improve the following deficits and impairments:  Increased muscle spasms,Decreased activity tolerance,Pain,Hypomobility,Impaired flexibility,Decreased strength,Postural dysfunction  Visit Diagnosis: Cramp and spasm  Pain in thoracic spine  Neck pain     Problem List Patient Active Problem List   Diagnosis Date Noted  . Hearing loss, bilateral 12/29/2016  . [place] 10/20/2015  . Other fatigue 10/20/2015  . Gait disturbance 10/20/2015   AAmador Cunas PT, DPT ADonald ProseSugg 06/17/2020, 9:30 AM  [place] [address]Phone: [phone]  Fax:  [phone] Name: SErick OxendineMRN: [id number] of Birth: [birth year]

## 2020-06-25 ENCOUNTER — Other Ambulatory Visit: Payer: Self-pay

## 2020-06-25 ENCOUNTER — Encounter: Payer: Self-pay | Admitting: Physical Therapy

## 2020-06-25 ENCOUNTER — Ambulatory Visit: Payer: BC Managed Care – PPO | Admitting: Physical Therapy

## 2020-06-25 DIAGNOSIS — R252 Cramp and spasm: Secondary | ICD-10-CM

## 2020-06-25 DIAGNOSIS — M542 Cervicalgia: Secondary | ICD-10-CM

## 2020-06-25 DIAGNOSIS — M546 Pain in thoracic spine: Secondary | ICD-10-CM

## 2020-06-25 NOTE — Therapy (Signed)
[place]. [place], [place], [address] Phone: [phone]   Fax:  [phone]  Physical Therapy Treatment  Patient Details  Name: Kendalynn Wideman MRN: [id number] Date of Birth: [birth year] Referring Provider (PT): Earnest Conroy Date: 06/25/2020   PT End of Session - 06/25/20 0924    Visit Number 7    Date for PT Re-Evaluation 07/26/20    PT Start Time 0847    PT Stop Time 0929    PT Time Calculation (min) 42 min    Activity Tolerance Patient tolerated treatment well    Behavior During Therapy [facility] for tasks assessed/performed           Past Medical History:  Diagnosis Date  . Hypertension   . Multiple sclerosis ([place])   . Vision abnormalities     Past Surgical History:  Procedure Laterality Date  . APPENDECTOMY    . EYE SURGERY    . OVARIAN CYST REMOVAL      There were no vitals filed for this visit.   Subjective Assessment - 06/25/20 0851    Subjective Pt reports mild neck pain today following a lot of driving last weekend    Currently in Pain? Yes    Pain Score 3     Pain Location Neck                             [facility] Adult PT Treatment/Exercise - 06/25/20 0001      Neck Exercises: Machines for Strengthening   Nustep L5 x 6 min    Cybex Chest Press 10# 2x10 with serratus push    Power Tower AR press 10# 1x10 B    Other Machines for Strengthening rows and lats 20# 2x10    Other Machines for Strengthening standing shoulder ext 10# 2x10      Neck Exercises: Standing   Other Standing Exercises red scap stab 3 ways x5 B    Other Standing Exercises W backs 2# 1x10; standing i's/y's/t's x10 2#      Moist Heat Therapy   Number Minutes Moist Heat 10 Minutes    Moist Heat Location Cervical      Electrical Stimulation   Electrical Stimulation Location B cervical/upper thoracic    Electrical Stimulation Action IFC    Electrical Stimulation Parameters seated    Electrical  Stimulation Goals Pain                    PT Short Term Goals - 06/01/20 1605      PT SHORT TERM GOAL #1   Title Pt will be I with initial HEP    Status Achieved             PT Long Term Goals - 06/17/20 0927      PT LONG TERM GOAL #1   Title Pt will be I with advanced HEP    Time 6    Period Weeks    Status Partially Met      PT LONG TERM GOAL #2   Title Pt will report 50% reduction in cervical/thoracic pain    Baseline 50% better    Time 6    Period Weeks    Status Achieved      PT LONG TERM GOAL #3   Title Pt will demo cervical AROM WFL with no increase in cervical pain    Baseline pt demos improved cervical ROM  and subjectively states can look behind her and look up with no pain now    Time 6    Period Weeks    Status Achieved      PT LONG TERM GOAL #4   Title Pt will understand posture/body mechanics    Time 6    Period Weeks    Status Partially Met                 Plan - 06/25/20 0924    Clinical Impression Statement Pt reporting mildly increased cervical/thoracic pain this rx following long drive this weekend. Education about posture modification with driving and also awareness of excessive shoulder elevation/posture throughout day. Pt tolerated ex's well with no reports of increased cervical pain with exercise this rx. Heat and estim at end to address pain.    PT Treatment/Interventions ADLs/Self Care Home Management;Electrical Stimulation;Iontophoresis 43m/ml Dexamethasone;Moist Heat;Traction;Neuromuscular re-education;Therapeutic exercise;Therapeutic activities;Patient/family education;Manual techniques;Dry needling;Passive range of motion;Taping    PT Next Visit Plan cervical/thoracic flexibility/strengthening, scap stab, manual/modalities as indicated    Consulted and Agree with Plan of Care Patient           Patient will benefit from skilled therapeutic intervention in order to improve the following deficits and impairments:   Increased muscle spasms,Decreased activity tolerance,Pain,Hypomobility,Impaired flexibility,Decreased strength,Postural dysfunction  Visit Diagnosis: Cramp and spasm  Neck pain  Pain in thoracic spine     Problem List Patient Active Problem List   Diagnosis Date Noted  . Hearing loss, bilateral 12/29/2016  . [place] 10/20/2015  . Other fatigue 10/20/2015  . Gait disturbance 10/20/2015   AAmador Cunas PT, DPT ADonald ProseSugg 06/25/2020, 9:26 AM  [place] [address]Phone: [phone]  Fax:  [phone] Name: SHector VenneMRN: [id number] of Birth: 4May 14, 1957

## 2020-06-26 DIAGNOSIS — G35 Multiple sclerosis: Secondary | ICD-10-CM | POA: Diagnosis not present

## 2020-06-26 DIAGNOSIS — M5412 Radiculopathy, cervical region: Secondary | ICD-10-CM | POA: Diagnosis not present

## 2020-06-26 DIAGNOSIS — R7303 Prediabetes: Secondary | ICD-10-CM | POA: Diagnosis not present

## 2020-07-13 ENCOUNTER — Other Ambulatory Visit: Payer: BC Managed Care – PPO

## 2020-07-13 ENCOUNTER — Ambulatory Visit: Payer: BC Managed Care – PPO | Admitting: Physical Therapy

## 2020-09-01 DIAGNOSIS — H401133 Primary open-angle glaucoma, bilateral, severe stage: Secondary | ICD-10-CM | POA: Diagnosis not present

## 2020-12-17 DIAGNOSIS — Z8249 Family history of ischemic heart disease and other diseases of the circulatory system: Secondary | ICD-10-CM | POA: Diagnosis not present

## 2020-12-17 DIAGNOSIS — E7800 Pure hypercholesterolemia, unspecified: Secondary | ICD-10-CM | POA: Diagnosis not present

## 2020-12-17 DIAGNOSIS — K635 Polyp of colon: Secondary | ICD-10-CM | POA: Diagnosis not present

## 2020-12-17 DIAGNOSIS — K648 Other hemorrhoids: Secondary | ICD-10-CM | POA: Diagnosis not present

## 2020-12-17 DIAGNOSIS — I10 Essential (primary) hypertension: Secondary | ICD-10-CM | POA: Diagnosis not present

## 2020-12-17 DIAGNOSIS — R7303 Prediabetes: Secondary | ICD-10-CM | POA: Diagnosis not present

## 2020-12-17 DIAGNOSIS — K5730 Diverticulosis of large intestine without perforation or abscess without bleeding: Secondary | ICD-10-CM | POA: Diagnosis not present

## 2021-01-15 DIAGNOSIS — U071 COVID-19: Secondary | ICD-10-CM | POA: Diagnosis not present

## 2021-01-15 DIAGNOSIS — R059 Cough, unspecified: Secondary | ICD-10-CM | POA: Diagnosis not present

## 2021-02-03 ENCOUNTER — Ambulatory Visit: Payer: BC Managed Care – PPO | Admitting: Neurology

## 2021-03-16 DIAGNOSIS — H401133 Primary open-angle glaucoma, bilateral, severe stage: Secondary | ICD-10-CM | POA: Diagnosis not present

## 2021-03-19 IMAGING — MG DIGITAL SCREENING BILAT W/ TOMO W/ CAD
6 of 12 series · 6 of 36 positions shown · non-contrast
Comparison: Previous exam(s).

CLINICAL DATA: Screening.

EXAM:
DIGITAL SCREENING BILATERAL MAMMOGRAM WITH TOMO AND CAD

[R CC synth-2D (1 of 2)]
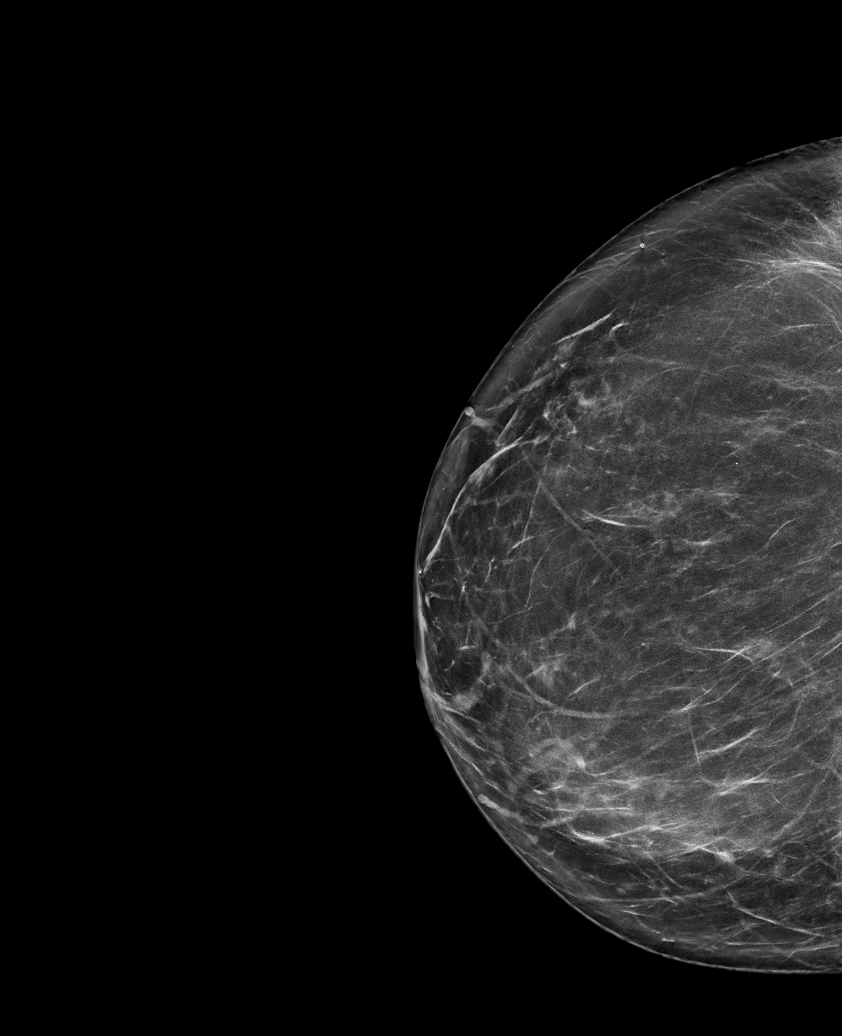

[L MLO synth-2D]
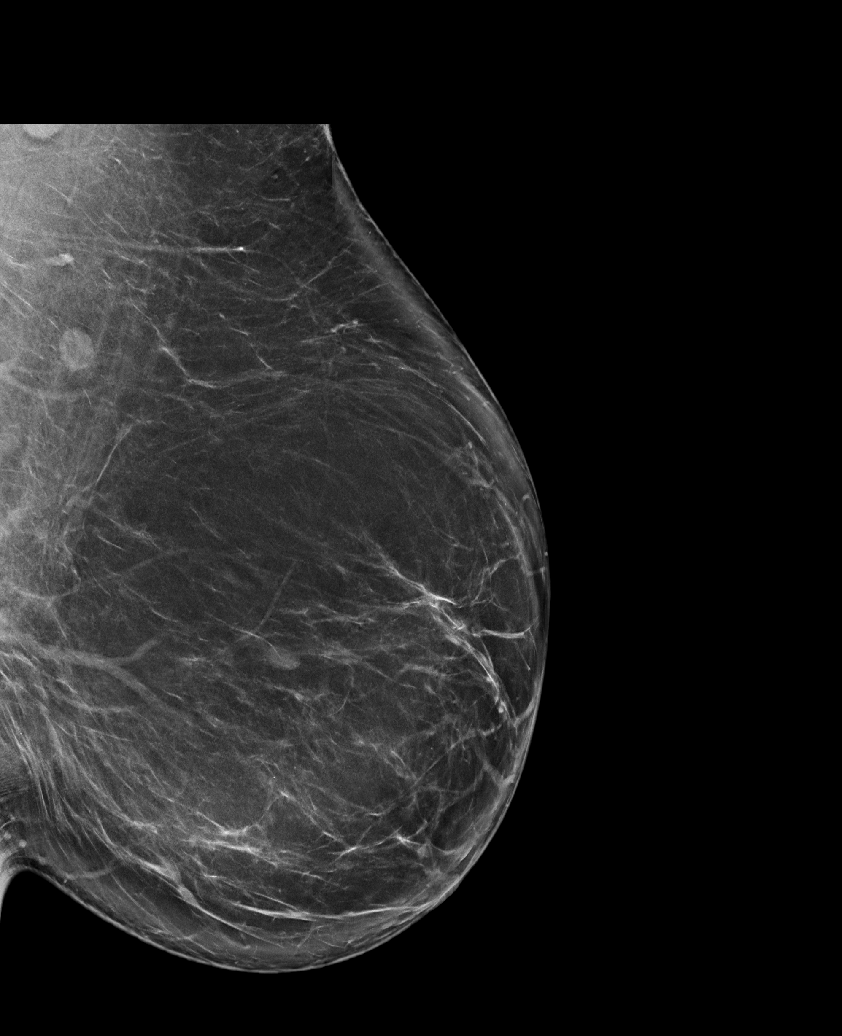

[R MLO synth-2D]
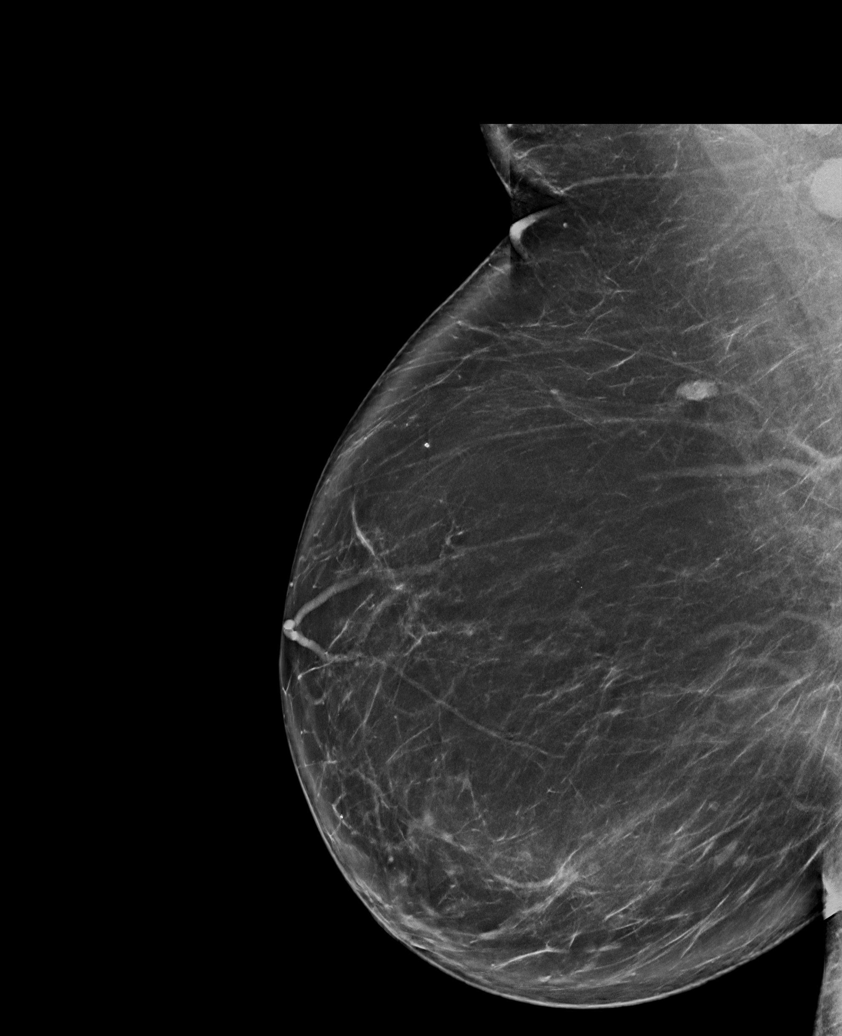

[L CC synth-2D (1 of 2)]
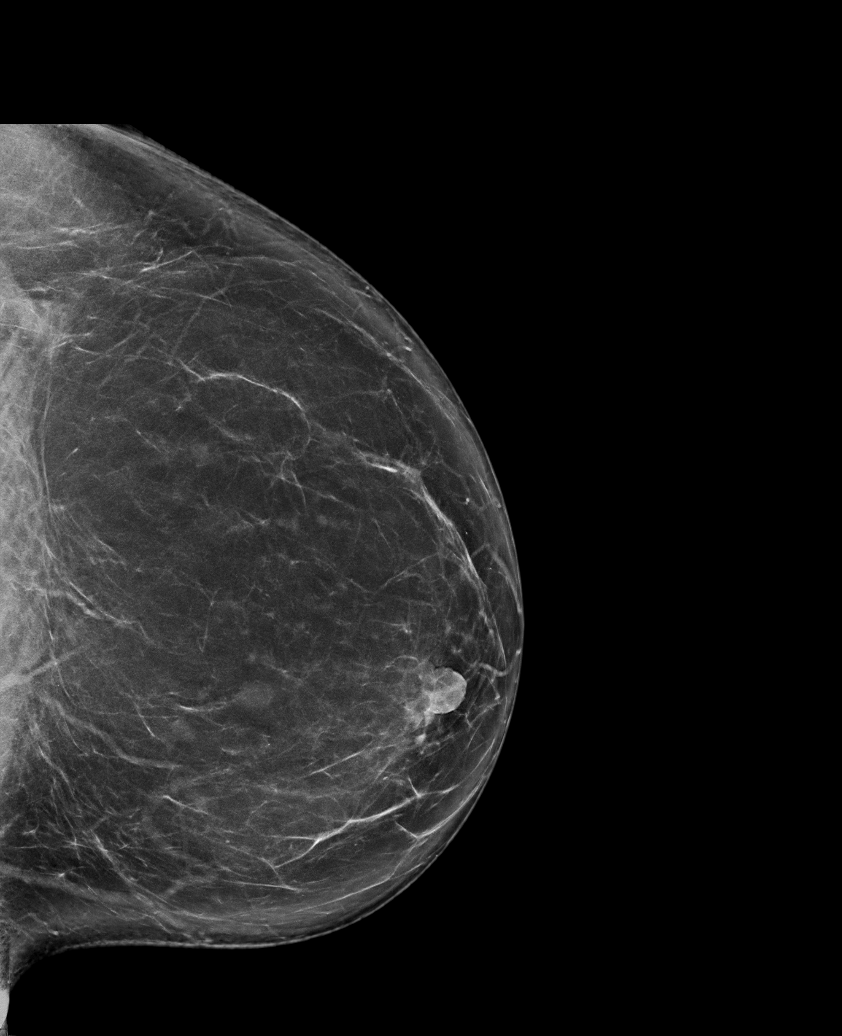

[L CC synth-2D (2 of 2)]
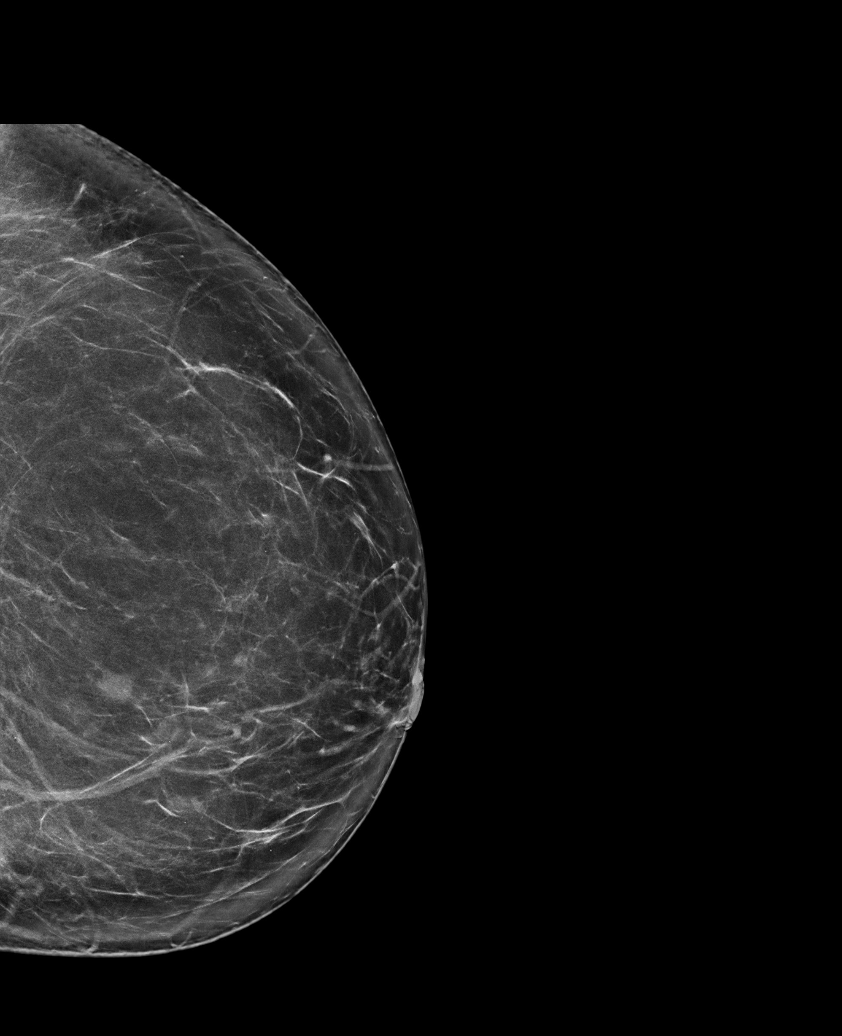

[R CC synth-2D (2 of 2)]
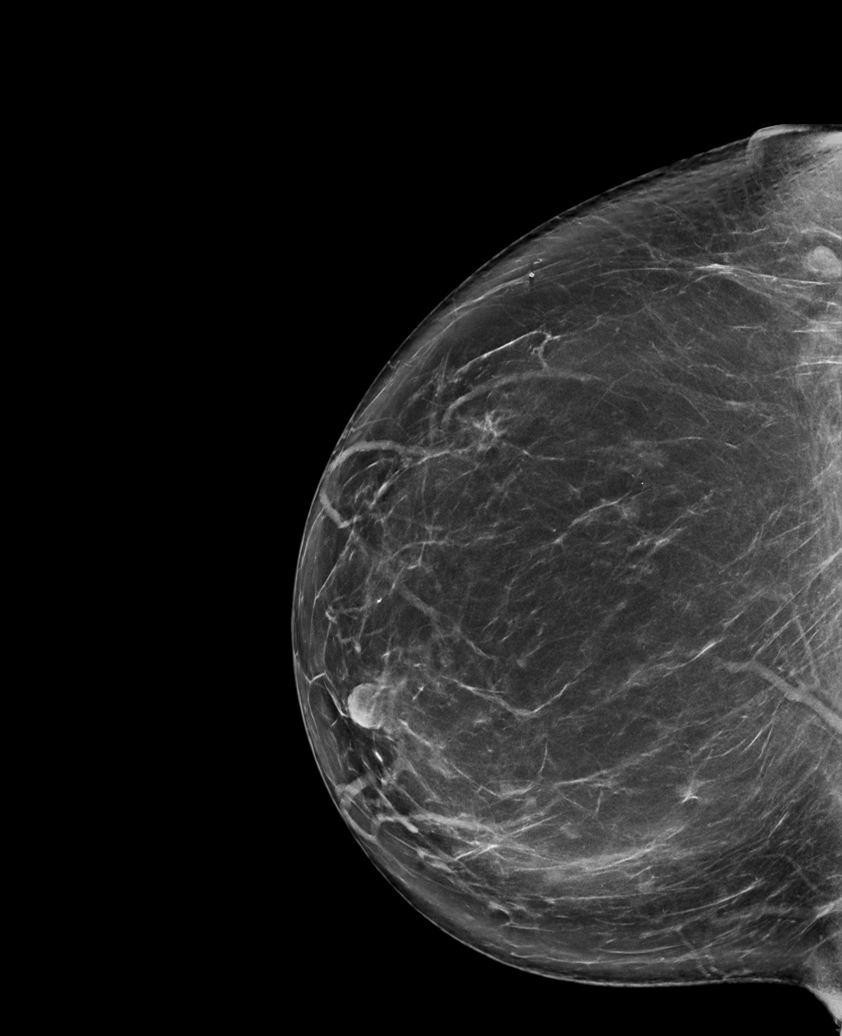

[6 of 36 positions shown; findings below may reference images not displayed]

ACR Breast Density Category b: There are scattered areas of
fibroglandular density.
FINDINGS: There are no findings suspicious for malignancy. Images were
processed with CAD.
IMPRESSION: No mammographic evidence of malignancy. A result letter of this
screening mammogram will be mailed directly to the patient.

RECOMMENDATION:
Screening mammogram in one year. (Code:[id number])

BI-RADS CATEGORY  1: Negative.

## 2021-04-15 ENCOUNTER — Other Ambulatory Visit: Payer: Self-pay | Admitting: Obstetrics and Gynecology

## 2021-04-15 DIAGNOSIS — Z23 Encounter for immunization: Secondary | ICD-10-CM | POA: Diagnosis not present

## 2021-04-15 DIAGNOSIS — I10 Essential (primary) hypertension: Secondary | ICD-10-CM | POA: Diagnosis not present

## 2021-04-15 DIAGNOSIS — Z1231 Encounter for screening mammogram for malignant neoplasm of breast: Secondary | ICD-10-CM

## 2021-04-15 DIAGNOSIS — Z0000 Encounter for general adult medical examination without abnormal findings: Secondary | ICD-10-CM | POA: Diagnosis not present

## 2021-04-15 DIAGNOSIS — R7303 Prediabetes: Secondary | ICD-10-CM | POA: Diagnosis not present

## 2021-04-15 DIAGNOSIS — E7800 Pure hypercholesterolemia, unspecified: Secondary | ICD-10-CM | POA: Diagnosis not present

## 2021-05-13 ENCOUNTER — Ambulatory Visit
Admission: RE | Admit: 2021-05-13 | Discharge: 2021-05-13 | Disposition: A | Discharge status: Home / Self Care | Payer: 59 | Source: Ambulatory Visit

## 2021-05-13 ENCOUNTER — Other Ambulatory Visit: Payer: Self-pay

## 2021-05-13 DIAGNOSIS — Z1231 Encounter for screening mammogram for malignant neoplasm of breast: Secondary | ICD-10-CM

## 2021-06-15 ENCOUNTER — Other Ambulatory Visit: Payer: Self-pay

## 2021-06-15 ENCOUNTER — Encounter: Payer: Self-pay | Admitting: Neurology

## 2021-06-15 ENCOUNTER — Ambulatory Visit: Payer: 59 | Admitting: Neurology

## 2021-06-15 VITALS — BP 133/74 | HR 63 | Ht 64.0 in | Wt 204.5 lb

## 2021-06-15 DIAGNOSIS — G35 Multiple sclerosis: Secondary | ICD-10-CM | POA: Diagnosis not present

## 2021-06-15 DIAGNOSIS — Z8669 Personal history of other diseases of the nervous system and sense organs: Secondary | ICD-10-CM | POA: Diagnosis not present

## 2021-06-15 NOTE — Progress Notes (Signed)
[facility]  PATIENT: Dominique Rogers DOB: December 25, 1955  REFERRING DOCTOR OR PCP:  Joan Mayans (fax [phone]( SOURCE: patient, records from Drs. Nile Riggs and Glade Lloyd, labwork.  _________________________________   HISTORICAL  CHIEF COMPLAINT:  Chief Complaint  Patient presents with   Follow-up    RM 1, alone. MS- off DMT. Doing well, no new sx.     HISTORY OF PRESENT ILLNESS:  Dominique Rogers is a 66 y.o. woman who was diagnosed with relapsing remitting MS in 2004.   Update 02/03/2020: She is off glatiramer acetate since we stopped it 02/03/2020.   She has no recent exacerbation (last one at 2004 diagnosis with right ON).  I reviewed MRI from 02/03/2014, 04/23/2015, 05/2016 and 02/27/2019 and they are unchanged with an overall small plaque burden.      She is walking well and is actually walks some daily.    She can climb stairs well   She holds the bannister going downstairs as she tripped on stairs (tripped on coat hem) once.  .    She has no numbness, weakness or ataxia.     Vision is doing well.   She has recent cataract surgery and cataract surgery.   She wears readers only.    She denies difficulty with color vision.  Bladder function is good.    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   She denies much fatigue.   She sleeps well.  She feels mood is doing ok.    She denies issues with cognition.     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    She tried to go to the gym 4 days a week including 2 with a trainer.      MS history: She was diagnosed in 2004 after presenting with right optic neuritis.   She saw her ophthalmologist who ordered an MRI. The images were consistent with multiple sclerosis and she was referred to Dr. Vito Backers in [place]. He did not think that she needed to have a lumbar puncture. She was started on Avonex initially did very well at  some point she switch to another doctor, Dr. Glade Lloyd in [place]. She was having mild side effects with Avonex and MRI showed mild progression and she switched to Tecfidera. However, on Tecfidera she had low lymphocyte counts and it was discontinued.  She was started on Copaxone about 2015.  She stopped glatiramer in late 2021  MRI of the brain 05/05/2016 did not show any new lesions compared to 04/23/2015  MRI 02/27/2019 no new lesions.  Overall small plaque burden  MRI 02/19/2020 showed Scattered T2/FLAIR hyperintense foci in the periventricular, juxtacortical and deep white matter of the hemispheres consistent with chronic demyelinating plaque.  None of the foci appear to be acute.  Compared to the MRI from 02/27/2019, there are no new lesions."  Also partially empty sell.    REVIEW OF SYSTEMS: Constitutional: No fevers, chills, sweats, or change in appetite.  She reports fatigue.   Insomnia is rare now Eyes: No visual changes, double vision, eye pain Ear, nose and throat: No hearing loss, ear pain, nasal congestion, sore throat Cardiovascular: No chest pain, palpitations Respiratory:  No shortness of breath at rest or with exertion.   No wheezes GastrointestinaI: No nausea, vomiting, diarrhea, abdominal pain, fecal incontinence Genitourinary:  No dysuria, urinary retention or frequency.  No nocturia. Musculoskeletal:  No neck pain, back pain Integumentary: No rash, pruritus, skin lesions Psych:   No depression or anxiety Endocrine: No palpitations, diaphoresis, change in appetite, change in weigh or increased thirst Hematologic/Lymphatic:  No anemia, purpura, petechiae. Allergic/Immunologic: No itchy/runny eyes, nasal congestion, recent allergic reactions, rashes  ALLERGIES: No Known Allergies  HOME MEDICATIONS:  Current Outpatient Medications:    cholecalciferol (VITAMIN D) 1000 units tablet, Take 1,000 Units by mouth daily., Disp: , Rfl:    ezetimibe (ZETIA) 10 MG  tablet, Take 10 mg by mouth daily., Disp: , Rfl:    losartan-hydrochlorothiazide (HYZAAR) 50-12.5 MG tablet, , Disp: , Rfl:   PAST MEDICAL HISTORY: Past Medical History:  Diagnosis Date   Hypertension    Multiple sclerosis (HCC)    Vision abnormalities     PAST SURGICAL HISTORY: Past Surgical History:  Procedure Laterality Date   APPENDECTOMY     EYE SURGERY     OVARIAN CYST REMOVAL      FAMILY HISTORY: Family History  Problem Relation Age of Onset   Diabetes Mother    Heart disease Mother    Breast cancer Mother    Breast cancer Sister     SOCIAL HISTORY:  Social History   Socioeconomic History   Marital status: Married    Spouse name: Not on file   Number of children: Not on file   Years of education: Not on file   Highest education level: Not on file  Occupational History   Not on file  Tobacco Use   Smoking status: Former   Smokeless tobacco: Never  Substance and Sexual Activity   Alcohol use: Yes    Alcohol/week: 0.0 standard drinks    Comment: occasional   Drug use: No   Sexual activity: Not on file  Other Topics Concern   Not on file  Social History Narrative   Not on file   Social Determinants of Health   Financial Resource Strain: Not on file  Food Insecurity: Not on file  Transportation Needs: Not on file  Physical Activity: Not on file  Stress: Not on file  Social Connections: Not on file  Intimate Partner Violence: Not on file     PHYSICAL EXAM  Vitals:   06/15/21 0824  BP: 133/74  Pulse: 63  Weight: 204 lb 8 oz (92.8 kg)  Height: 5\' 4"  (1.626 m)    Body mass index is 35.1 kg/m.   General: The patient is well-developed and well-nourished and in no acute distress.   Fundoscopic exam shows mild optic pallor on the right but no papilledema.   Neurologic Exam  Mental status: The patient is alert and oriented x 3 at the time of the examination. The patient has apparent normal recent and remote memory, with an apparently normal  attention span and concentration ability.   Speech is normal.  Cranial nerves: Extraocular movements are full.  Today, color vision was symmetric.  Facial strength and sensation is normal. Trapezius strength is normal. No dysarthria is noted.No obvious hearing deficits are noted.  Motor:  Muscle bulk is normal.   Tone is normal. Strength is  5 / 5 in all 4 extremities.   Sensory: Intact touch and vibration sensation  Coordination: Cerebellar testing reveals good finger-nose-finger bilaterally.  Gait and station: Station is normal.  Gait is normal for age.  Tandem gait is minimally wide.  Romberg is negative.   Reflexes: Deep tendon reflexes are symmetric and normal bilaterally.      ASSESSMENT AND PLAN  Multiple sclerosis (HCC) - Plan: MR BRAIN W WO CONTRAST  History of optic neuritis   1.   She has been stable off of Copaxone  for more than a year.  We will check an MRI of the brain and compare to her previous MRI.  If she has breakthrough activity we will need to reconsider restarting a disease modifying therapy.  If no breakthrough activity, I would recommend checking another MRI in about 1 to 1-1/2 years.   2.   Stray active and exercise 3.   She will return to see me in 12 months but call sooner if she has any new or worsening neurologic symptoms.    Malakie Balis A. Epimenio Foot, MD, PhD 06/15/2021, 10:57 AM Certified in Neurology, Clinical Neurophysiology, Sleep Medicine, Pain Medicine and Neuroimaging  [facility] [address] [place], [place] [address] [phone]

## 2021-06-17 ENCOUNTER — Telehealth: Payer: Self-pay | Admitting: Neurology

## 2021-06-17 NOTE — Telephone Encounter (Signed)
MR Brain w/wo contrast Dr. Epimenio Foot [facility]: [id number] (exp. 06/17/21 to 08/01/21). Patient is scheduled at [facility] for 06/30/21.

## 2021-06-30 ENCOUNTER — Ambulatory Visit: Payer: 59

## 2021-06-30 DIAGNOSIS — G35 Multiple sclerosis: Secondary | ICD-10-CM | POA: Diagnosis not present

## 2021-06-30 MED ORDER — GADOBENATE DIMEGLUMINE 529 MG/ML IV SOLN
20.0000 mL | Freq: Once | INTRAVENOUS | Status: AC | PRN
  Administered 2021-06-30: 20 mL via INTRAVENOUS

## 2021-09-20 DIAGNOSIS — M50322 Other cervical disc degeneration at C5-C6 level: Secondary | ICD-10-CM | POA: Diagnosis not present

## 2021-09-20 DIAGNOSIS — M5384 Other specified dorsopathies, thoracic region: Secondary | ICD-10-CM | POA: Diagnosis not present

## 2021-09-20 DIAGNOSIS — M9901 Segmental and somatic dysfunction of cervical region: Secondary | ICD-10-CM | POA: Diagnosis not present

## 2021-09-20 DIAGNOSIS — M9902 Segmental and somatic dysfunction of thoracic region: Secondary | ICD-10-CM | POA: Diagnosis not present

## 2021-09-21 DIAGNOSIS — M50322 Other cervical disc degeneration at C5-C6 level: Secondary | ICD-10-CM | POA: Diagnosis not present

## 2021-09-21 DIAGNOSIS — M5384 Other specified dorsopathies, thoracic region: Secondary | ICD-10-CM | POA: Diagnosis not present

## 2021-09-21 DIAGNOSIS — M9901 Segmental and somatic dysfunction of cervical region: Secondary | ICD-10-CM | POA: Diagnosis not present

## 2021-09-21 DIAGNOSIS — M9902 Segmental and somatic dysfunction of thoracic region: Secondary | ICD-10-CM | POA: Diagnosis not present

## 2021-09-23 DIAGNOSIS — M9901 Segmental and somatic dysfunction of cervical region: Secondary | ICD-10-CM | POA: Diagnosis not present

## 2021-09-23 DIAGNOSIS — M5384 Other specified dorsopathies, thoracic region: Secondary | ICD-10-CM | POA: Diagnosis not present

## 2021-09-23 DIAGNOSIS — M9902 Segmental and somatic dysfunction of thoracic region: Secondary | ICD-10-CM | POA: Diagnosis not present

## 2021-09-23 DIAGNOSIS — M50322 Other cervical disc degeneration at C5-C6 level: Secondary | ICD-10-CM | POA: Diagnosis not present

## 2021-09-28 DIAGNOSIS — M9902 Segmental and somatic dysfunction of thoracic region: Secondary | ICD-10-CM | POA: Diagnosis not present

## 2021-09-28 DIAGNOSIS — M50322 Other cervical disc degeneration at C5-C6 level: Secondary | ICD-10-CM | POA: Diagnosis not present

## 2021-09-28 DIAGNOSIS — M9901 Segmental and somatic dysfunction of cervical region: Secondary | ICD-10-CM | POA: Diagnosis not present

## 2021-09-28 DIAGNOSIS — M5384 Other specified dorsopathies, thoracic region: Secondary | ICD-10-CM | POA: Diagnosis not present

## 2021-09-30 DIAGNOSIS — M9902 Segmental and somatic dysfunction of thoracic region: Secondary | ICD-10-CM | POA: Diagnosis not present

## 2021-09-30 DIAGNOSIS — M9901 Segmental and somatic dysfunction of cervical region: Secondary | ICD-10-CM | POA: Diagnosis not present

## 2021-09-30 DIAGNOSIS — M50322 Other cervical disc degeneration at C5-C6 level: Secondary | ICD-10-CM | POA: Diagnosis not present

## 2021-09-30 DIAGNOSIS — M5384 Other specified dorsopathies, thoracic region: Secondary | ICD-10-CM | POA: Diagnosis not present

## 2021-10-04 DIAGNOSIS — M9901 Segmental and somatic dysfunction of cervical region: Secondary | ICD-10-CM | POA: Diagnosis not present

## 2021-10-04 DIAGNOSIS — M5384 Other specified dorsopathies, thoracic region: Secondary | ICD-10-CM | POA: Diagnosis not present

## 2021-10-04 DIAGNOSIS — M50322 Other cervical disc degeneration at C5-C6 level: Secondary | ICD-10-CM | POA: Diagnosis not present

## 2021-10-04 DIAGNOSIS — M9902 Segmental and somatic dysfunction of thoracic region: Secondary | ICD-10-CM | POA: Diagnosis not present

## 2021-10-05 DIAGNOSIS — M9901 Segmental and somatic dysfunction of cervical region: Secondary | ICD-10-CM | POA: Diagnosis not present

## 2021-10-05 DIAGNOSIS — M50322 Other cervical disc degeneration at C5-C6 level: Secondary | ICD-10-CM | POA: Diagnosis not present

## 2021-10-05 DIAGNOSIS — M9902 Segmental and somatic dysfunction of thoracic region: Secondary | ICD-10-CM | POA: Diagnosis not present

## 2021-10-05 DIAGNOSIS — M5384 Other specified dorsopathies, thoracic region: Secondary | ICD-10-CM | POA: Diagnosis not present

## 2021-10-07 DIAGNOSIS — M9901 Segmental and somatic dysfunction of cervical region: Secondary | ICD-10-CM | POA: Diagnosis not present

## 2021-10-07 DIAGNOSIS — M9902 Segmental and somatic dysfunction of thoracic region: Secondary | ICD-10-CM | POA: Diagnosis not present

## 2021-10-07 DIAGNOSIS — M5384 Other specified dorsopathies, thoracic region: Secondary | ICD-10-CM | POA: Diagnosis not present

## 2021-10-07 DIAGNOSIS — M50322 Other cervical disc degeneration at C5-C6 level: Secondary | ICD-10-CM | POA: Diagnosis not present

## 2021-10-11 DIAGNOSIS — M5384 Other specified dorsopathies, thoracic region: Secondary | ICD-10-CM | POA: Diagnosis not present

## 2021-10-11 DIAGNOSIS — M50322 Other cervical disc degeneration at C5-C6 level: Secondary | ICD-10-CM | POA: Diagnosis not present

## 2021-10-11 DIAGNOSIS — M9901 Segmental and somatic dysfunction of cervical region: Secondary | ICD-10-CM | POA: Diagnosis not present

## 2021-10-11 DIAGNOSIS — M9902 Segmental and somatic dysfunction of thoracic region: Secondary | ICD-10-CM | POA: Diagnosis not present

## 2021-10-13 DIAGNOSIS — M9901 Segmental and somatic dysfunction of cervical region: Secondary | ICD-10-CM | POA: Diagnosis not present

## 2021-10-13 DIAGNOSIS — M9902 Segmental and somatic dysfunction of thoracic region: Secondary | ICD-10-CM | POA: Diagnosis not present

## 2021-10-13 DIAGNOSIS — M5384 Other specified dorsopathies, thoracic region: Secondary | ICD-10-CM | POA: Diagnosis not present

## 2021-10-13 DIAGNOSIS — M50322 Other cervical disc degeneration at C5-C6 level: Secondary | ICD-10-CM | POA: Diagnosis not present

## 2021-10-25 DIAGNOSIS — M9901 Segmental and somatic dysfunction of cervical region: Secondary | ICD-10-CM | POA: Diagnosis not present

## 2021-10-25 DIAGNOSIS — M50322 Other cervical disc degeneration at C5-C6 level: Secondary | ICD-10-CM | POA: Diagnosis not present

## 2021-10-25 DIAGNOSIS — M9902 Segmental and somatic dysfunction of thoracic region: Secondary | ICD-10-CM | POA: Diagnosis not present

## 2021-10-25 DIAGNOSIS — M5384 Other specified dorsopathies, thoracic region: Secondary | ICD-10-CM | POA: Diagnosis not present

## 2021-11-03 DIAGNOSIS — M5384 Other specified dorsopathies, thoracic region: Secondary | ICD-10-CM | POA: Diagnosis not present

## 2021-11-03 DIAGNOSIS — M9901 Segmental and somatic dysfunction of cervical region: Secondary | ICD-10-CM | POA: Diagnosis not present

## 2021-11-03 DIAGNOSIS — M9902 Segmental and somatic dysfunction of thoracic region: Secondary | ICD-10-CM | POA: Diagnosis not present

## 2021-11-03 DIAGNOSIS — M50322 Other cervical disc degeneration at C5-C6 level: Secondary | ICD-10-CM | POA: Diagnosis not present

## 2021-11-09 DIAGNOSIS — H401133 Primary open-angle glaucoma, bilateral, severe stage: Secondary | ICD-10-CM | POA: Diagnosis not present

## 2021-11-16 DIAGNOSIS — M5384 Other specified dorsopathies, thoracic region: Secondary | ICD-10-CM | POA: Diagnosis not present

## 2021-11-16 DIAGNOSIS — M50322 Other cervical disc degeneration at C5-C6 level: Secondary | ICD-10-CM | POA: Diagnosis not present

## 2021-11-16 DIAGNOSIS — M9901 Segmental and somatic dysfunction of cervical region: Secondary | ICD-10-CM | POA: Diagnosis not present

## 2021-11-16 DIAGNOSIS — M9902 Segmental and somatic dysfunction of thoracic region: Secondary | ICD-10-CM | POA: Diagnosis not present

## 2021-11-18 DIAGNOSIS — M5384 Other specified dorsopathies, thoracic region: Secondary | ICD-10-CM | POA: Diagnosis not present

## 2021-11-18 DIAGNOSIS — M9902 Segmental and somatic dysfunction of thoracic region: Secondary | ICD-10-CM | POA: Diagnosis not present

## 2021-11-18 DIAGNOSIS — M50322 Other cervical disc degeneration at C5-C6 level: Secondary | ICD-10-CM | POA: Diagnosis not present

## 2021-11-18 DIAGNOSIS — M9901 Segmental and somatic dysfunction of cervical region: Secondary | ICD-10-CM | POA: Diagnosis not present

## 2021-11-25 DIAGNOSIS — M9902 Segmental and somatic dysfunction of thoracic region: Secondary | ICD-10-CM | POA: Diagnosis not present

## 2021-11-25 DIAGNOSIS — M5384 Other specified dorsopathies, thoracic region: Secondary | ICD-10-CM | POA: Diagnosis not present

## 2021-11-25 DIAGNOSIS — M9901 Segmental and somatic dysfunction of cervical region: Secondary | ICD-10-CM | POA: Diagnosis not present

## 2021-11-25 DIAGNOSIS — M50322 Other cervical disc degeneration at C5-C6 level: Secondary | ICD-10-CM | POA: Diagnosis not present

## 2022-02-14 DIAGNOSIS — R7303 Prediabetes: Secondary | ICD-10-CM | POA: Diagnosis not present

## 2022-02-14 DIAGNOSIS — I10 Essential (primary) hypertension: Secondary | ICD-10-CM | POA: Diagnosis not present

## 2022-03-02 DIAGNOSIS — R7303 Prediabetes: Secondary | ICD-10-CM | POA: Diagnosis not present

## 2022-03-02 DIAGNOSIS — I10 Essential (primary) hypertension: Secondary | ICD-10-CM | POA: Diagnosis not present

## 2022-03-29 DIAGNOSIS — M25511 Pain in right shoulder: Secondary | ICD-10-CM | POA: Diagnosis not present

## 2022-03-30 DIAGNOSIS — M25511 Pain in right shoulder: Secondary | ICD-10-CM | POA: Diagnosis not present

## 2022-04-18 DIAGNOSIS — I10 Essential (primary) hypertension: Secondary | ICD-10-CM | POA: Diagnosis not present

## 2022-04-18 DIAGNOSIS — R7303 Prediabetes: Secondary | ICD-10-CM | POA: Diagnosis not present

## 2022-05-11 DIAGNOSIS — R7303 Prediabetes: Secondary | ICD-10-CM | POA: Diagnosis not present

## 2022-05-11 DIAGNOSIS — I10 Essential (primary) hypertension: Secondary | ICD-10-CM | POA: Diagnosis not present

## 2022-05-11 DIAGNOSIS — Z6834 Body mass index (BMI) 34.0-34.9, adult: Secondary | ICD-10-CM | POA: Diagnosis not present

## 2022-05-17 ENCOUNTER — Other Ambulatory Visit: Payer: Self-pay | Admitting: Obstetrics and Gynecology

## 2022-05-17 DIAGNOSIS — H401133 Primary open-angle glaucoma, bilateral, severe stage: Secondary | ICD-10-CM | POA: Diagnosis not present

## 2022-05-17 DIAGNOSIS — Z1231 Encounter for screening mammogram for malignant neoplasm of breast: Secondary | ICD-10-CM

## 2022-05-27 ENCOUNTER — Ambulatory Visit: Payer: 59

## 2022-06-07 ENCOUNTER — Other Ambulatory Visit: Payer: Self-pay | Admitting: Obstetrics and Gynecology

## 2022-06-07 ENCOUNTER — Other Ambulatory Visit (HOSPITAL_COMMUNITY)
Admission: RE | Admit: 2022-06-07 | Discharge: 2022-06-07 | Disposition: A | Discharge status: Home / Self Care | Payer: Medicare HMO | Source: Ambulatory Visit | Attending: Obstetrics and Gynecology | Admitting: Obstetrics and Gynecology

## 2022-06-07 DIAGNOSIS — Z1151 Encounter for screening for human papillomavirus (HPV): Secondary | ICD-10-CM | POA: Insufficient documentation

## 2022-06-07 DIAGNOSIS — Z01419 Encounter for gynecological examination (general) (routine) without abnormal findings: Secondary | ICD-10-CM | POA: Diagnosis not present

## 2022-06-08 ENCOUNTER — Ambulatory Visit
Admission: RE | Admit: 2022-06-08 | Discharge: 2022-06-08 | Disposition: A | Discharge status: Home / Self Care | Payer: Medicare HMO | Source: Ambulatory Visit | Attending: Obstetrics and Gynecology | Admitting: Obstetrics and Gynecology

## 2022-06-08 DIAGNOSIS — Z1231 Encounter for screening mammogram for malignant neoplasm of breast: Secondary | ICD-10-CM

## 2022-06-09 LAB — CYTOLOGY - PAP
Comment: NEGATIVE
Diagnosis: NEGATIVE
High risk HPV: NEGATIVE

## 2022-06-10 ENCOUNTER — Other Ambulatory Visit: Payer: Self-pay | Admitting: Family Medicine

## 2022-06-10 DIAGNOSIS — E2839 Other primary ovarian failure: Secondary | ICD-10-CM

## 2022-06-10 DIAGNOSIS — G35 Multiple sclerosis: Secondary | ICD-10-CM | POA: Diagnosis not present

## 2022-06-10 DIAGNOSIS — Z23 Encounter for immunization: Secondary | ICD-10-CM | POA: Diagnosis not present

## 2022-06-10 DIAGNOSIS — Z6836 Body mass index (BMI) 36.0-36.9, adult: Secondary | ICD-10-CM | POA: Diagnosis not present

## 2022-06-10 DIAGNOSIS — Z0000 Encounter for general adult medical examination without abnormal findings: Secondary | ICD-10-CM | POA: Diagnosis not present

## 2022-06-10 DIAGNOSIS — I10 Essential (primary) hypertension: Secondary | ICD-10-CM | POA: Diagnosis not present

## 2022-06-10 DIAGNOSIS — R7303 Prediabetes: Secondary | ICD-10-CM | POA: Diagnosis not present

## 2022-06-10 DIAGNOSIS — E7800 Pure hypercholesterolemia, unspecified: Secondary | ICD-10-CM | POA: Diagnosis not present

## 2022-06-13 DIAGNOSIS — Z6834 Body mass index (BMI) 34.0-34.9, adult: Secondary | ICD-10-CM | POA: Diagnosis not present

## 2022-06-13 DIAGNOSIS — I10 Essential (primary) hypertension: Secondary | ICD-10-CM | POA: Diagnosis not present

## 2022-06-13 DIAGNOSIS — R7303 Prediabetes: Secondary | ICD-10-CM | POA: Diagnosis not present

## 2022-06-13 DIAGNOSIS — Z9189 Other specified personal risk factors, not elsewhere classified: Secondary | ICD-10-CM | POA: Diagnosis not present

## 2022-06-15 ENCOUNTER — Encounter: Payer: Self-pay | Admitting: Neurology

## 2022-06-15 ENCOUNTER — Ambulatory Visit: Payer: Medicare HMO | Admitting: Neurology

## 2022-06-15 VITALS — BP 117/75 | HR 65 | Ht 64.0 in | Wt 210.5 lb

## 2022-06-15 DIAGNOSIS — R269 Unspecified abnormalities of gait and mobility: Secondary | ICD-10-CM | POA: Diagnosis not present

## 2022-06-15 DIAGNOSIS — Z8669 Personal history of other diseases of the nervous system and sense organs: Secondary | ICD-10-CM | POA: Diagnosis not present

## 2022-06-15 DIAGNOSIS — G35 Multiple sclerosis: Secondary | ICD-10-CM | POA: Diagnosis not present

## 2022-06-15 DIAGNOSIS — R5383 Other fatigue: Secondary | ICD-10-CM

## 2022-06-15 NOTE — Progress Notes (Signed)
[facility]  PATIENT: Dominique Rogers DOB: May 27, 1955  REFERRING DOCTOR OR PCP:  Lenore Cordia ( SOURCE: patient, records from Drs. Gershon Crane and Bubba Camp, labwork.  _________________________________   HISTORICAL  CHIEF COMPLAINT:  Chief Complaint  Patient presents with   Room 10    Pt is here Alone. Pt states that things have been going good. Pt states no muscle weakness. Pt states no burning or tingling in her legs.     HISTORY OF PRESENT ILLNESS:  Dominique Rogers is a 67 y.o. woman who was diagnosed with relapsing remitting MS in 2004.   Update 06/15/2022: She is off glatiramer acetate since we stopped it 02/03/2020.   She has no recent exacerbation (last one at 2004 diagnosis with right ON).  MRIs from 02/03/2014, 04/23/2015, 05/2016 and 02/27/2019 and 06/30/2021 and they are stable with an overall small plaque burden.      She is walking well and she does 5000-6000 steps most days and goes to the gym..    She can climb stairs well  She uses the bannister downstairs only.   She has no numbness, weakness or ataxia.     Vision is doing well.   She has cataract surgery and cataract surgery and glaucoma surgery in past..   She wears readers only.    She denies difficulty with color vision.  Bladder function is good.     She denies much fatigue.   She sleeps well.  She feels mood is doing ok.    She denies issues with cognition.     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     She is teaching some and also taking classes for a doctorate in business.  MS history: She was diagnosed in 2004 after presenting with right optic neuritis.   She saw her ophthalmologist who ordered an MRI. The images were consistent with multiple sclerosis and she was referred to Dr. Joneen Caraway in [place]. He did not think that she needed to have a lumbar puncture. She was started on Avonex initially did very well at some point she switch to another doctor,  Dr. Bubba Camp in [place]. She was having mild side effects with Avonex and MRI showed mild progression and she switched to Tecfidera. However, on Tecfidera she had low lymphocyte counts and it was discontinued.  She was started on Copaxone about 2015.  She stopped glatiramer in late 2021  MRI of the brain 05/05/2016 did not show any new lesions compared to 04/23/2015  MRI 02/27/2019 no new lesions.  Overall small plaque burden  MRI 02/19/2020 showed Scattered T2/FLAIR hyperintense foci in the periventricular, juxtacortical and deep white matter of the hemispheres consistent with chronic demyelinating plaque.  None of the foci appear to be acute.  Compared to the MRI from 02/27/2019, there are no new lesions."  Also partially empty sell.    MRI 06/30/2021 showe no change.  REVIEW OF SYSTEMS: Constitutional: No fevers, chills, sweats, or change in appetite.  She reports fatigue.   Insomnia is rare now Eyes: No visual changes, double vision, eye pain Ear, nose and throat: No hearing loss, ear pain, nasal congestion, sore throat Cardiovascular: No chest pain, palpitations Respiratory:  No shortness of breath at rest or with exertion.   No wheezes GastrointestinaI: No nausea, vomiting, diarrhea, abdominal pain, fecal incontinence Genitourinary:  No dysuria, urinary retention or frequency.  No nocturia. Musculoskeletal:  No neck pain, back pain Integumentary: No rash, pruritus, skin  lesions Psych:   No depression or anxiety Endocrine: No palpitations, diaphoresis, change in appetite, change in weigh or increased thirst Hematologic/Lymphatic:  No anemia, purpura, petechiae. Allergic/Immunologic: No itchy/runny eyes, nasal congestion, recent allergic reactions, rashes  ALLERGIES: No Known Allergies  HOME MEDICATIONS:  Current Outpatient Medications:    cholecalciferol (VITAMIN D) 1000 units tablet, Take 1,000 Units by mouth daily., Disp: , Rfl:    dorzolamide-timolol (COSOPT) 2-0.5 %  ophthalmic solution, Place 1 drop into both eyes 2 (two) times daily., Disp: , Rfl:    ezetimibe (ZETIA) 10 MG tablet, Take 10 mg by mouth daily., Disp: , Rfl:    latanoprost (XALATAN) 0.005 % ophthalmic solution, Place 1 drop into both eyes at bedtime., Disp: , Rfl:    losartan-hydrochlorothiazide (HYZAAR) 50-12.5 MG tablet, , Disp: , Rfl:    metformin (FORTAMET) 500 MG (OSM) 24 hr tablet, Take 500 mg by mouth daily with breakfast., Disp: , Rfl:    rosuvastatin (CRESTOR) 20 MG tablet, Take 20 mg by mouth daily., Disp: , Rfl:   PAST MEDICAL HISTORY: Past Medical History:  Diagnosis Date   Does use hearing aid    Hypertension    Multiple sclerosis ([place])    Vision abnormalities     PAST SURGICAL HISTORY: Past Surgical History:  Procedure Laterality Date   APPENDECTOMY     EYE SURGERY     OVARIAN CYST REMOVAL      FAMILY HISTORY: Family History  Problem Relation Age of Onset   Diabetes Mother    Heart disease Mother    Breast cancer Mother    Breast cancer Sister     SOCIAL HISTORY:  Social History   Socioeconomic History   Marital status: Married    Spouse name: Not on file   Number of children: Not on file   Years of education: Not on file   Highest education level: Not on file  Occupational History   Not on file  Tobacco Use   Smoking status: Former   Smokeless tobacco: Never  Substance and Sexual Activity   Alcohol use: Yes    Alcohol/week: 0.0 standard drinks of alcohol    Comment: occasional   Drug use: No   Sexual activity: Not on file  Other Topics Concern   Not on file  Social History Narrative   Not on file   Social Determinants of Health   Financial Resource Strain: Not on file  Food Insecurity: Not on file  Transportation Needs: Not on file  Physical Activity: Not on file  Stress: Not on file  Social Connections: Not on file  Intimate Partner Violence: Not on file     PHYSICAL EXAM  Vitals:   06/15/22 0828  BP: 117/75  Pulse: 65   Weight: 210 lb 8 oz (95.5 kg)  Height: 5' 4"$  (1.626 m)    Body mass index is 36.13 kg/m.   General: The patient is well-developed and well-nourished and in no acute distress.   Fundoscopic exam shows mild optic pallor on the right but no papilledema.   Neurologic Exam  Mental status: The patient is alert and oriented x 3 at the time of the examination. The patient has apparent normal recent and remote memory, with an apparently normal attention span and concentration ability.   Speech is normal.  Cranial nerves: Extraocular movements are full. Color vision was symmetric.  Acuity was symmetric.  Facial strength and sensation is normal. Trapezius strength is normal. No dysarthria is noted.No obvious hearing deficits are  noted.  Motor:  Muscle bulk is normal.   Tone is normal. Strength is  5 / 5 in all 4 extremities.   Sensory: Intact touch and vibration sensation  Coordination: Cerebellar testing reveals good finger-nose-finger bilaterally.  Gait and station: Station is normal.  Gait is normal for age.  Tandem gait is minimally wide but normal for age.  Romberg is negative.   Reflexes: Deep tendon reflexes are symmetric and normal bilaterally.      ASSESSMENT AND PLAN  Multiple sclerosis ([place])  History of optic neuritis  Other fatigue  Gait disturbance   1.   She has been stable off of Copaxone  for more than a year.  We will check an MRI of the brain at next visit,   If she has breakthrough activity we will need to reconsider restarting a disease modifying therapy.   2.   Stray active and exercise 3.   She will return to see me in 12 months but call sooner if she has any new or worsening neurologic symptoms.    Aneira Cavitt A. Felecia Shelling, MD, PhD [id number], [id number] Certified in Neurology, Clinical Neurophysiology, Sleep Medicine, Pain Medicine and Neuroimaging  [facility] [address] [place], [place] [address] [phone]

## 2022-07-11 DIAGNOSIS — I10 Essential (primary) hypertension: Secondary | ICD-10-CM | POA: Diagnosis not present

## 2022-07-11 DIAGNOSIS — Z6834 Body mass index (BMI) 34.0-34.9, adult: Secondary | ICD-10-CM | POA: Diagnosis not present

## 2022-07-11 DIAGNOSIS — R7303 Prediabetes: Secondary | ICD-10-CM | POA: Diagnosis not present

## 2022-08-17 DIAGNOSIS — I10 Essential (primary) hypertension: Secondary | ICD-10-CM | POA: Diagnosis not present

## 2022-08-17 DIAGNOSIS — Z6835 Body mass index (BMI) 35.0-35.9, adult: Secondary | ICD-10-CM | POA: Diagnosis not present

## 2022-08-17 DIAGNOSIS — R7303 Prediabetes: Secondary | ICD-10-CM | POA: Diagnosis not present

## 2022-08-29 DIAGNOSIS — H903 Sensorineural hearing loss, bilateral: Secondary | ICD-10-CM | POA: Diagnosis not present

## 2022-11-15 DIAGNOSIS — H401133 Primary open-angle glaucoma, bilateral, severe stage: Secondary | ICD-10-CM | POA: Diagnosis not present

## 2022-11-25 ENCOUNTER — Ambulatory Visit
Admission: RE | Admit: 2022-11-25 | Discharge: 2022-11-25 | Disposition: A | Discharge status: Home / Self Care | Payer: Medicare HMO | Source: Ambulatory Visit | Attending: Family Medicine | Admitting: Family Medicine

## 2022-11-25 DIAGNOSIS — E349 Endocrine disorder, unspecified: Secondary | ICD-10-CM | POA: Diagnosis not present

## 2022-11-25 DIAGNOSIS — G35 Multiple sclerosis: Secondary | ICD-10-CM | POA: Diagnosis not present

## 2022-11-25 DIAGNOSIS — E2839 Other primary ovarian failure: Secondary | ICD-10-CM

## 2022-11-25 DIAGNOSIS — N958 Other specified menopausal and perimenopausal disorders: Secondary | ICD-10-CM | POA: Diagnosis not present

## 2022-12-08 DIAGNOSIS — K5900 Constipation, unspecified: Secondary | ICD-10-CM | POA: Diagnosis not present

## 2022-12-08 DIAGNOSIS — R7303 Prediabetes: Secondary | ICD-10-CM | POA: Diagnosis not present

## 2022-12-08 DIAGNOSIS — I10 Essential (primary) hypertension: Secondary | ICD-10-CM | POA: Diagnosis not present

## 2022-12-08 DIAGNOSIS — E7800 Pure hypercholesterolemia, unspecified: Secondary | ICD-10-CM | POA: Diagnosis not present

## 2023-01-09 DIAGNOSIS — Z9889 Other specified postprocedural states: Secondary | ICD-10-CM | POA: Diagnosis not present

## 2023-01-09 DIAGNOSIS — Z961 Presence of intraocular lens: Secondary | ICD-10-CM | POA: Diagnosis not present

## 2023-01-09 DIAGNOSIS — H401113 Primary open-angle glaucoma, right eye, severe stage: Secondary | ICD-10-CM | POA: Diagnosis not present

## 2023-01-09 DIAGNOSIS — H401123 Primary open-angle glaucoma, left eye, severe stage: Secondary | ICD-10-CM | POA: Diagnosis not present

## 2023-06-01 ENCOUNTER — Telehealth: Payer: Self-pay | Admitting: Neurology

## 2023-06-01 NOTE — Telephone Encounter (Signed)
Due to a work schedule conflict, appointment needed to be r/s with wait list

## 2023-06-21 ENCOUNTER — Ambulatory Visit: Payer: Medicare HMO | Admitting: Neurology

## 2023-06-30 DIAGNOSIS — E7800 Pure hypercholesterolemia, unspecified: Secondary | ICD-10-CM | POA: Diagnosis not present

## 2023-06-30 DIAGNOSIS — H6121 Impacted cerumen, right ear: Secondary | ICD-10-CM | POA: Diagnosis not present

## 2023-06-30 DIAGNOSIS — Z0000 Encounter for general adult medical examination without abnormal findings: Secondary | ICD-10-CM | POA: Diagnosis not present

## 2023-06-30 DIAGNOSIS — G5601 Carpal tunnel syndrome, right upper limb: Secondary | ICD-10-CM | POA: Diagnosis not present

## 2023-06-30 DIAGNOSIS — R7303 Prediabetes: Secondary | ICD-10-CM | POA: Diagnosis not present

## 2023-06-30 DIAGNOSIS — I10 Essential (primary) hypertension: Secondary | ICD-10-CM | POA: Diagnosis not present

## 2023-07-13 ENCOUNTER — Other Ambulatory Visit: Payer: Self-pay | Admitting: Family Medicine

## 2023-07-13 DIAGNOSIS — Z1231 Encounter for screening mammogram for malignant neoplasm of breast: Secondary | ICD-10-CM

## 2023-07-17 ENCOUNTER — Ambulatory Visit (INDEPENDENT_AMBULATORY_CARE_PROVIDER_SITE_OTHER): Admitting: Neurology

## 2023-07-17 ENCOUNTER — Ambulatory Visit
Admission: RE | Admit: 2023-07-17 | Discharge: 2023-07-17 | Disposition: A | Discharge status: Home / Self Care | Source: Ambulatory Visit | Attending: Family Medicine

## 2023-07-17 ENCOUNTER — Encounter

## 2023-07-17 ENCOUNTER — Encounter: Payer: Self-pay | Admitting: Neurology

## 2023-07-17 VITALS — BP 105/64 | HR 79 | Ht 64.0 in | Wt 216.0 lb

## 2023-07-17 DIAGNOSIS — R269 Unspecified abnormalities of gait and mobility: Secondary | ICD-10-CM

## 2023-07-17 DIAGNOSIS — Z8669 Personal history of other diseases of the nervous system and sense organs: Secondary | ICD-10-CM | POA: Diagnosis not present

## 2023-07-17 DIAGNOSIS — R5383 Other fatigue: Secondary | ICD-10-CM

## 2023-07-17 DIAGNOSIS — G35 Multiple sclerosis: Secondary | ICD-10-CM | POA: Diagnosis not present

## 2023-07-17 DIAGNOSIS — Z1231 Encounter for screening mammogram for malignant neoplasm of breast: Secondary | ICD-10-CM | POA: Diagnosis not present

## 2023-07-17 DIAGNOSIS — H10501 Unspecified blepharoconjunctivitis, right eye: Secondary | ICD-10-CM | POA: Diagnosis not present

## 2023-07-17 NOTE — Progress Notes (Signed)
 [facility]  PATIENT: Dominique Rogers DOB: April 14, 1956  REFERRING DOCTOR OR PCP:  Joan Mayans ( SOURCE: patient, records from Drs. Nile Riggs and Glade Lloyd, labwork.  _________________________________   HISTORICAL  CHIEF COMPLAINT:  Chief Complaint  Patient presents with   Follow-up    Rm10,  Multiple sclerosis:reports been off meds for 2 years and doing well, History of optic neuritis: pt stated has glaucoma and right eye has started to ache and is pink currently, fatigue: stated has more energy and works out at [employer] 3x's per week, Gait disturbance:denied balance issues       HISTORY OF PRESENT ILLNESS:  Dominique Rogers is a 68 y.o. woman who was diagnosed with relapsing remitting MS in 2004.   Update 07/17/2023 She is off glatiramer acetate since we stopped it 02/03/2020.  She has not had any exacerbations since stopping.   Her last exacerbation (last one at 2004 diagnosis with right ON).  MRIs from 02/03/2014, 04/23/2015, 05/2016 and 02/27/2019 and 06/30/2021 and they are stable with an overall small plaque burden.      She is active and goes to the [employer].   She is walking well and she does 5000-6000 steps most days and goes to the gym..    She can climb stairs well but will use the bannister downstairs only.   She has no numbness, weakness or ataxia.       She currently has eye redness OD.   She had surgery for glaucoma OD in September 2024.   Peripheral vision is reduced in each eye.   She also  has cataract surgery and cataract surgery.     She wears readers only.    She denies difficulty with color vision.  Bladder function is good.     She denies much fatigue.   She sleeps well.  She feels mood is doing ok.    She denies issues with cognition.     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     She is teaching some and also taking classes for a doctorate in business.  MS history: She was diagnosed in 2004 after presenting with  right optic neuritis.   She saw her ophthalmologist who ordered an MRI. The images were consistent with multiple sclerosis and she was referred to Dr. Vito Backers in [place]. He did not think that she needed to have a lumbar puncture. She was started on Avonex initially did very well at some point she switch to another doctor, Dr. Glade Lloyd in [place]. She was having mild side effects with Avonex and MRI showed mild progression and she switched to Tecfidera. However, on Tecfidera she had low lymphocyte counts and it was discontinued.  She was started on Copaxone about 2015.  She stopped glatiramer in late 2021  MRI of the brain 05/05/2016 did not show any new lesions compared to 04/23/2015  MRI 02/27/2019 no new lesions.  Overall small plaque burden  MRI 02/19/2020 showed Scattered T2/FLAIR hyperintense foci in the periventricular, juxtacortical and deep white matter of the hemispheres consistent with chronic demyelinating plaque.  None of the foci appear to be acute.  Compared to the MRI from 02/27/2019, there are no new lesions."  Also partially empty sell.    MRI 06/30/2021 showe no change.  REVIEW OF SYSTEMS: Constitutional: No fevers, chills, sweats, or change in appetite.  She reports fatigue.   Insomnia is rare now Eyes: No visual changes, double vision, eye  pain Ear, nose and throat: No hearing loss, ear pain, nasal congestion, sore throat Cardiovascular: No chest pain, palpitations Respiratory:  No shortness of breath at rest or with exertion.   No wheezes GastrointestinaI: No nausea, vomiting, diarrhea, abdominal pain, fecal incontinence Genitourinary:  No dysuria, urinary retention or frequency.  No nocturia. Musculoskeletal:  No neck pain, back pain Integumentary: No rash, pruritus, skin lesions Psych:   No depression or anxiety Endocrine: No palpitations, diaphoresis, change in appetite, change in weigh or increased thirst Hematologic/Lymphatic:  No anemia, purpura,  petechiae. Allergic/Immunologic: No itchy/runny eyes, nasal congestion, recent allergic reactions, rashes  ALLERGIES: No Known Allergies  HOME MEDICATIONS:  Current Outpatient Medications:    cholecalciferol (VITAMIN D) 1000 units tablet, Take 1,000 Units by mouth daily., Disp: , Rfl:    dorzolamide-timolol (COSOPT) 2-0.5 % ophthalmic solution, Place 1 drop into both eyes 2 (two) times daily., Disp: , Rfl:    ezetimibe (ZETIA) 10 MG tablet, Take 10 mg by mouth daily., Disp: , Rfl:    losartan-hydrochlorothiazide (HYZAAR) 50-12.5 MG tablet, , Disp: , Rfl:    rosuvastatin (CRESTOR) 20 MG tablet, Take 20 mg by mouth daily., Disp: , Rfl:   PAST MEDICAL HISTORY: Past Medical History:  Diagnosis Date   Does use hearing aid    Hypertension    Multiple sclerosis ([facility])    Vision abnormalities     PAST SURGICAL HISTORY: Past Surgical History:  Procedure Laterality Date   APPENDECTOMY     EYE SURGERY     OVARIAN CYST REMOVAL      FAMILY HISTORY: Family History  Problem Relation Age of Onset   Diabetes Mother    Heart disease Mother    Breast cancer Mother    Breast cancer Sister     SOCIAL HISTORY:  Social History   Socioeconomic History   Marital status: Married    Spouse name: Not on file   Number of children: Not on file   Years of education: Not on file   Highest education level: Not on file  Occupational History   Not on file  Tobacco Use   Smoking status: Former   Smokeless tobacco: Never  Substance and Sexual Activity   Alcohol use: Yes    Alcohol/week: 0.0 standard drinks of alcohol    Comment: occasional   Drug use: No   Sexual activity: Not on file  Other Topics Concern   Not on file  Social History Narrative   Not on file   Social Drivers of Health   Financial Resource Strain: Low Risk  (04/19/2018)   Received from [facility], [facility]   Overall Financial Resource Strain (CARDIA)    Difficulty of Paying  Living Expenses: Not hard at all  Food Insecurity: Unknown (04/19/2018)   Received from [facility], [facility]   Hunger Vital Sign    Worried About Running Out of Food in the Last Year: Never true    Ran Out of Food in the Last Year: Not on file  Transportation Needs: No Transportation Needs (04/19/2018)   Received from [facility], [facility]   [facility] - Transportation    In the past 12 months, has lack of transportation kept you from medical appointments or from getting medications?: No    Lack of Transportation (Non-Medical): No  Physical Activity: Inactive (04/19/2018)   Received from [facility], [facility]   Exercise Vital Sign  Days of Exercise per Week: 0 days    Minutes of Exercise per Session: 0 min  Stress: No Stress Concern Present (04/19/2018)   Received from [facility], [facility]   [employer] of Occupational Health - Occupational Stress Questionnaire    Feeling of Stress : Not at all  Social Connections: Unknown (04/19/2018)   Received from [facility], [facility]   Social Connection and Isolation Panel [NHANES]    Frequency of Communication with Friends and Family: Patient declined    Frequency of Social Gatherings with Friends and Family: Patient declined    Attends Religious Services: Patient declined    Database administrator or Organizations: Patient declined    Attends Banker Meetings: Patient declined    Marital Status: Patient declined  Intimate Partner Violence: Not on file     PHYSICAL EXAM  Vitals:   07/17/23 1055  BP: 105/64  Pulse: 79  Weight: 216 lb (98 kg)  Height: 5\' 4"  (1.626 m)    Body mass index is 37.08 kg/m.   General: The patient is well-developed and well-nourished and in no acute distress.  Mild redness OD  Neurologic  Exam  Mental status: The patient is alert and oriented x 3 at the time of the examination. The patient has apparent normal recent and remote memory, with an apparently normal attention span and concentration ability.   Speech is normal.  Cranial nerves: Extraocular movements are full.  Facial strength and sensation is normal. Trapezius strength is normal. No dysarthria is noted.No obvious hearing deficits are noted.  Motor:  Muscle bulk is normal.   Tone is normal. Strength is  5 / 5 in all 4 extremities.   Sensory: Intact touch and vibration sensation  Coordination: Cerebellar testing reveals good finger-nose-finger bilaterally.  Gait and station: Station is normal.  Gait is normal for age.  The tandem gait is mildly wide but likely normal for age. .  Romberg is negative.   Reflexes: Deep tendon reflexes are symmetric and normal bilaterally.      ASSESSMENT AND PLAN  Multiple sclerosis ([facility]) - Plan: MR BRAIN W WO CONTRAST  History of optic neuritis  Other fatigue  Gait disturbance - Plan: MR BRAIN W WO CONTRAST   1.   She has been stable off of Copaxone  for more than a year.  We will check an MRI of the brain.  If she has breakthrough activity we will need to reconsider restarting a disease modifying therapy.     After this one consider MRI about every 3 years 2.   Stray active and exercise 3.   She will return to see me in 12 months but call sooner if she has any new or worsening neurologic symptoms.   This visit is part of a comprehensive longitudinal care medical relationship regarding the patients primary diagnosis of MS and related concerns.    Assunta Pupo A. Epimenio Foot, MD, PhD 07/17/2023, 11:24 AM Certified in Neurology, Clinical Neurophysiology, Sleep Medicine, Pain Medicine and Neuroimaging  [facility] [address] [place], [place] [address] [phone]

## 2023-07-22 ENCOUNTER — Telehealth: Payer: Self-pay | Admitting: Neurology

## 2023-07-22 NOTE — Telephone Encounter (Signed)
 Ethlyn Gallery: [id number] exp. 07/22/23-09/20/23 sent to GI [phone]

## 2023-08-08 DIAGNOSIS — H1031 Unspecified acute conjunctivitis, right eye: Secondary | ICD-10-CM | POA: Diagnosis not present

## 2023-09-12 ENCOUNTER — Other Ambulatory Visit

## 2023-09-19 DIAGNOSIS — H401133 Primary open-angle glaucoma, bilateral, severe stage: Secondary | ICD-10-CM | POA: Diagnosis not present

## 2023-10-18 ENCOUNTER — Ambulatory Visit: Payer: Self-pay | Admitting: Neurology

## 2023-10-18 ENCOUNTER — Ambulatory Visit
Admission: RE | Admit: 2023-10-18 | Discharge: 2023-10-18 | Disposition: A | Discharge status: Home / Self Care | Source: Ambulatory Visit | Attending: Neurology | Admitting: Neurology

## 2023-10-18 DIAGNOSIS — G35 Multiple sclerosis: Secondary | ICD-10-CM | POA: Diagnosis not present

## 2023-10-18 DIAGNOSIS — R269 Unspecified abnormalities of gait and mobility: Secondary | ICD-10-CM | POA: Diagnosis not present

## 2023-10-18 MED ORDER — GADOPICLENOL 0.5 MMOL/ML IV SOLN
10.0000 mL | Freq: Once | INTRAVENOUS | Status: AC | PRN
  Administered 2023-10-18: 10 mL via INTRAVENOUS

## 2023-10-27 DIAGNOSIS — I10 Essential (primary) hypertension: Secondary | ICD-10-CM | POA: Diagnosis not present

## 2023-11-25 DIAGNOSIS — I10 Essential (primary) hypertension: Secondary | ICD-10-CM | POA: Diagnosis not present

## 2023-11-30 DIAGNOSIS — I10 Essential (primary) hypertension: Secondary | ICD-10-CM | POA: Diagnosis not present

## 2023-11-30 DIAGNOSIS — E7800 Pure hypercholesterolemia, unspecified: Secondary | ICD-10-CM | POA: Diagnosis not present

## 2023-12-14 ENCOUNTER — Ambulatory Visit: Payer: Medicare HMO | Admitting: Neurology

## 2023-12-20 DIAGNOSIS — R112 Nausea with vomiting, unspecified: Secondary | ICD-10-CM | POA: Diagnosis not present

## 2023-12-20 DIAGNOSIS — Z6837 Body mass index (BMI) 37.0-37.9, adult: Secondary | ICD-10-CM | POA: Diagnosis not present

## 2023-12-25 DIAGNOSIS — I10 Essential (primary) hypertension: Secondary | ICD-10-CM | POA: Diagnosis not present

## 2023-12-31 DIAGNOSIS — I10 Essential (primary) hypertension: Secondary | ICD-10-CM | POA: Diagnosis not present

## 2023-12-31 DIAGNOSIS — E7800 Pure hypercholesterolemia, unspecified: Secondary | ICD-10-CM | POA: Diagnosis not present

## 2024-01-02 DIAGNOSIS — R110 Nausea: Secondary | ICD-10-CM | POA: Diagnosis not present

## 2024-01-02 DIAGNOSIS — I10 Essential (primary) hypertension: Secondary | ICD-10-CM | POA: Diagnosis not present

## 2024-01-02 DIAGNOSIS — E7800 Pure hypercholesterolemia, unspecified: Secondary | ICD-10-CM | POA: Diagnosis not present

## 2024-01-30 DIAGNOSIS — H401133 Primary open-angle glaucoma, bilateral, severe stage: Secondary | ICD-10-CM | POA: Diagnosis not present

## 2024-01-30 DIAGNOSIS — E7800 Pure hypercholesterolemia, unspecified: Secondary | ICD-10-CM | POA: Diagnosis not present

## 2024-01-30 DIAGNOSIS — I10 Essential (primary) hypertension: Secondary | ICD-10-CM | POA: Diagnosis not present

## 2024-02-21 DIAGNOSIS — I10 Essential (primary) hypertension: Secondary | ICD-10-CM | POA: Diagnosis not present

## 2024-03-01 DIAGNOSIS — I10 Essential (primary) hypertension: Secondary | ICD-10-CM | POA: Diagnosis not present

## 2024-03-01 DIAGNOSIS — E7800 Pure hypercholesterolemia, unspecified: Secondary | ICD-10-CM | POA: Diagnosis not present

## 2024-03-22 DIAGNOSIS — I10 Essential (primary) hypertension: Secondary | ICD-10-CM | POA: Diagnosis not present

## 2024-03-31 DIAGNOSIS — E7800 Pure hypercholesterolemia, unspecified: Secondary | ICD-10-CM | POA: Diagnosis not present

## 2024-03-31 DIAGNOSIS — I10 Essential (primary) hypertension: Secondary | ICD-10-CM | POA: Diagnosis not present

## 2024-04-11 DIAGNOSIS — M79671 Pain in right foot: Secondary | ICD-10-CM | POA: Diagnosis not present

## 2024-04-11 DIAGNOSIS — M21621 Bunionette of right foot: Secondary | ICD-10-CM | POA: Diagnosis not present

## 2024-04-11 DIAGNOSIS — M722 Plantar fascial fibromatosis: Secondary | ICD-10-CM | POA: Diagnosis not present

## 2024-04-11 DIAGNOSIS — M7671 Peroneal tendinitis, right leg: Secondary | ICD-10-CM | POA: Diagnosis not present

## 2024-07-16 ENCOUNTER — Ambulatory Visit: Admitting: Neurology
